# Patient Record
Sex: Female | Born: 1985 | Race: Black or African American | Hispanic: No | Marital: Single | Smoking: Current every day smoker
Health system: Southern US, Community
[De-identification: ages and names within clinical notes are randomized; demographics above are authoritative.]

## PROBLEM LIST (undated history)

## (undated) DIAGNOSIS — Z789 Other specified health status: Secondary | ICD-10-CM

## (undated) DIAGNOSIS — Z720 Tobacco use: Secondary | ICD-10-CM

## (undated) DIAGNOSIS — F1090 Alcohol use, unspecified, uncomplicated: Secondary | ICD-10-CM

## (undated) HISTORY — PX: HERNIA REPAIR: SHX51

---

## 2004-09-17 ENCOUNTER — Emergency Department: Payer: Self-pay | Admitting: General Practice

## 2009-02-08 ENCOUNTER — Ambulatory Visit: Payer: Self-pay | Admitting: Emergency Medicine

## 2009-04-26 ENCOUNTER — Emergency Department: Payer: Self-pay | Admitting: Unknown Physician Specialty

## 2010-03-10 ENCOUNTER — Emergency Department: Payer: Self-pay | Admitting: Emergency Medicine

## 2011-03-10 ENCOUNTER — Emergency Department: Payer: Self-pay | Admitting: Emergency Medicine

## 2011-04-15 ENCOUNTER — Emergency Department: Payer: Self-pay | Admitting: Internal Medicine

## 2011-04-18 ENCOUNTER — Encounter: Payer: Self-pay | Admitting: Emergency Medicine

## 2011-05-16 ENCOUNTER — Encounter: Payer: Self-pay | Admitting: Emergency Medicine

## 2012-06-16 ENCOUNTER — Emergency Department: Payer: Self-pay | Admitting: Emergency Medicine

## 2012-06-16 LAB — URINALYSIS, COMPLETE
Bilirubin,UR: NEGATIVE
Ketone: NEGATIVE
Nitrite: POSITIVE
RBC,UR: 8 /HPF (ref 0–5)
Specific Gravity: 1.021 (ref 1.003–1.030)

## 2012-06-16 LAB — COMPREHENSIVE METABOLIC PANEL
Albumin: 3.7 g/dL (ref 3.4–5.0)
BUN: 8 mg/dL (ref 7–18)
Calcium, Total: 8.7 mg/dL (ref 8.5–10.1)
Potassium: 3.9 mmol/L (ref 3.5–5.1)
SGPT (ALT): 21 U/L (ref 12–78)
Sodium: 139 mmol/L (ref 136–145)

## 2012-06-16 LAB — CBC
HCT: 39.6 % (ref 35.0–47.0)
MCH: 31 pg (ref 26.0–34.0)
MCHC: 33.5 g/dL (ref 32.0–36.0)
RBC: 4.29 10*6/uL (ref 3.80–5.20)
RDW: 13.5 % (ref 11.5–14.5)

## 2012-06-16 LAB — PREGNANCY, URINE: Pregnancy Test, Urine: NEGATIVE m[IU]/mL

## 2012-06-23 ENCOUNTER — Emergency Department: Payer: Self-pay | Admitting: Emergency Medicine

## 2012-06-23 LAB — COMPREHENSIVE METABOLIC PANEL
Alkaline Phosphatase: 70 U/L (ref 50–136)
Anion Gap: 5 — ABNORMAL LOW (ref 7–16)
BUN: 6 mg/dL — ABNORMAL LOW (ref 7–18)
Calcium, Total: 9.4 mg/dL (ref 8.5–10.1)
Creatinine: 0.86 mg/dL (ref 0.60–1.30)
EGFR (African American): >60
EGFR (Non-African Amer.): >60
Glucose: 86 mg/dL (ref 65–99)
Potassium: 3.8 mmol/L (ref 3.5–5.1)
SGPT (ALT): 21 U/L (ref 12–78)
Sodium: 138 mmol/L (ref 136–145)

## 2012-06-23 LAB — URINALYSIS, COMPLETE
Bilirubin,UR: NEGATIVE
Blood: NEGATIVE
Glucose,UR: NEGATIVE mg/dL (ref 0–75)
Ketone: NEGATIVE
Ph: 6 (ref 4.5–8.0)
Protein: NEGATIVE
RBC,UR: 2 /HPF (ref 0–5)
Squamous Epithelial: 9

## 2012-06-23 LAB — CBC WITH DIFFERENTIAL/PLATELET
Eosinophil #: 0 10*3/uL (ref 0.0–0.7)
Eosinophil %: 0.3 %
HGB: 13.8 g/dL (ref 12.0–16.0)
Lymphocyte %: 15.7 %
MCV: 92 fL (ref 80–100)
Monocyte #: 0.5 x10 3/mm (ref 0.2–0.9)
Monocyte %: 5.4 %
Neutrophil #: 7.9 10*3/uL — ABNORMAL HIGH (ref 1.4–6.5)
Platelet: 295 10*3/uL (ref 150–440)
RBC: 4.41 10*6/uL (ref 3.80–5.20)
RDW: 14 % (ref 11.5–14.5)
WBC: 10.1 10*3/uL (ref 3.6–11.0)

## 2012-06-23 LAB — PREGNANCY, URINE: Pregnancy Test, Urine: NEGATIVE m[IU]/mL

## 2012-10-24 ENCOUNTER — Emergency Department: Payer: Self-pay | Admitting: Emergency Medicine

## 2012-11-07 ENCOUNTER — Emergency Department: Payer: Self-pay | Admitting: Internal Medicine

## 2012-11-07 LAB — URINALYSIS, COMPLETE
Bilirubin,UR: NEGATIVE
Blood: NEGATIVE
Ph: 8 (ref 4.5–8.0)
Protein: 100
Specific Gravity: 1.019 (ref 1.003–1.030)
WBC UR: 4 /HPF (ref 0–5)

## 2012-11-07 LAB — COMPREHENSIVE METABOLIC PANEL
Albumin: 4.4 g/dL (ref 3.4–5.0)
Anion Gap: 7 (ref 7–16)
Bilirubin,Total: 0.5 mg/dL (ref 0.2–1.0)
Calcium, Total: 9.3 mg/dL (ref 8.5–10.1)
Chloride: 102 mmol/L (ref 98–107)
Co2: 25 mmol/L (ref 21–32)
Creatinine: 0.63 mg/dL (ref 0.60–1.30)
EGFR (African American): >60
EGFR (Non-African Amer.): >60
Glucose: 109 mg/dL — ABNORMAL HIGH (ref 65–99)
Potassium: 3.7 mmol/L (ref 3.5–5.1)
SGOT(AST): 14 U/L — ABNORMAL LOW (ref 15–37)
SGPT (ALT): 23 U/L (ref 12–78)
Sodium: 134 mmol/L — ABNORMAL LOW (ref 136–145)

## 2012-11-07 LAB — CBC
HCT: 41.9 % (ref 35.0–47.0)
MCV: 91 fL (ref 80–100)
RBC: 4.61 10*6/uL (ref 3.80–5.20)
RDW: 13.7 % (ref 11.5–14.5)
WBC: 10.8 10*3/uL (ref 3.6–11.0)

## 2012-11-07 LAB — LIPASE, BLOOD: Lipase: 79 U/L (ref 73–393)

## 2013-09-24 ENCOUNTER — Emergency Department: Payer: Self-pay | Admitting: Emergency Medicine

## 2013-09-24 LAB — CBC WITH DIFFERENTIAL/PLATELET
Basophil #: 0 10*3/uL (ref 0.0–0.1)
Basophil %: 0.6 %
EOS ABS: 0 10*3/uL (ref 0.0–0.7)
Eosinophil %: 0.3 %
HCT: 37.9 % (ref 35.0–47.0)
HGB: 12.7 g/dL (ref 12.0–16.0)
Lymphocyte #: 2.6 10*3/uL (ref 1.0–3.6)
Lymphocyte %: 29.5 %
MCH: 31.4 pg (ref 26.0–34.0)
MCHC: 33.6 g/dL (ref 32.0–36.0)
MCV: 94 fL (ref 80–100)
MONO ABS: 0.8 x10 3/mm (ref 0.2–0.9)
Monocyte %: 9.4 %
Neutrophil #: 5.2 10*3/uL (ref 1.4–6.5)
Neutrophil %: 60.2 %
Platelet: 237 10*3/uL (ref 150–440)
RBC: 4.05 10*6/uL (ref 3.80–5.20)
RDW: 13.6 % (ref 11.5–14.5)
WBC: 8.7 10*3/uL (ref 3.6–11.0)

## 2013-09-24 LAB — COMPREHENSIVE METABOLIC PANEL
Albumin: 3.5 g/dL (ref 3.4–5.0)
Alkaline Phosphatase: 68 U/L
Anion Gap: 3 — ABNORMAL LOW (ref 7–16)
BUN: 8 mg/dL (ref 7–18)
Bilirubin,Total: 0.2 mg/dL (ref 0.2–1.0)
CHLORIDE: 106 mmol/L (ref 98–107)
CO2: 28 mmol/L (ref 21–32)
Calcium, Total: 9 mg/dL (ref 8.5–10.1)
Creatinine: 0.83 mg/dL (ref 0.60–1.30)
EGFR (African American): >60
Glucose: 88 mg/dL (ref 65–99)
Osmolality: 272 (ref 275–301)
Potassium: 4.1 mmol/L (ref 3.5–5.1)
SGOT(AST): 19 U/L (ref 15–37)
SGPT (ALT): 17 U/L (ref 12–78)
Sodium: 137 mmol/L (ref 136–145)
Total Protein: 7.7 g/dL (ref 6.4–8.2)

## 2013-09-24 LAB — URINALYSIS, COMPLETE
BLOOD: NEGATIVE
Bacteria: NONE SEEN
Bilirubin,UR: NEGATIVE
Glucose,UR: NEGATIVE mg/dL (ref 0–75)
KETONE: NEGATIVE
NITRITE: NEGATIVE
PH: 6 (ref 4.5–8.0)
PROTEIN: NEGATIVE
SPECIFIC GRAVITY: 1.021 (ref 1.003–1.030)
WBC UR: 7 /HPF (ref 0–5)

## 2013-09-24 LAB — PREGNANCY, URINE: PREGNANCY TEST, URINE: NEGATIVE m[IU]/mL

## 2013-11-17 ENCOUNTER — Emergency Department: Payer: Self-pay | Admitting: Emergency Medicine

## 2013-11-17 LAB — COMPREHENSIVE METABOLIC PANEL
ALBUMIN: 4.2 g/dL (ref 3.4–5.0)
ALK PHOS: 83 U/L
Anion Gap: 7 (ref 7–16)
BUN: 5 mg/dL — ABNORMAL LOW (ref 7–18)
Bilirubin,Total: 0.6 mg/dL (ref 0.2–1.0)
CALCIUM: 9.5 mg/dL (ref 8.5–10.1)
CHLORIDE: 99 mmol/L (ref 98–107)
Co2: 27 mmol/L (ref 21–32)
Creatinine: 0.57 mg/dL — ABNORMAL LOW (ref 0.60–1.30)
EGFR (African American): >60
EGFR (Non-African Amer.): >60
Glucose: 117 mg/dL — ABNORMAL HIGH (ref 65–99)
Osmolality: 265 (ref 275–301)
Potassium: 3.9 mmol/L (ref 3.5–5.1)
SGOT(AST): 16 U/L (ref 15–37)
SGPT (ALT): 18 U/L (ref 12–78)
Sodium: 133 mmol/L — ABNORMAL LOW (ref 136–145)
Total Protein: 8.8 g/dL — ABNORMAL HIGH (ref 6.4–8.2)

## 2013-11-17 LAB — URINALYSIS, COMPLETE
BILIRUBIN, UR: NEGATIVE
Blood: NEGATIVE
GLUCOSE, UR: NEGATIVE mg/dL (ref 0–75)
NITRITE: NEGATIVE
Ph: 8 (ref 4.5–8.0)
Protein: 30
Specific Gravity: 1.016 (ref 1.003–1.030)
Squamous Epithelial: 7
WBC UR: 5 /HPF (ref 0–5)

## 2013-11-17 LAB — CBC
HCT: 41.6 % (ref 35.0–47.0)
HGB: 14.1 g/dL (ref 12.0–16.0)
MCH: 31.6 pg (ref 26.0–34.0)
MCHC: 33.8 g/dL (ref 32.0–36.0)
MCV: 93 fL (ref 80–100)
Platelet: 256 10*3/uL (ref 150–440)
RBC: 4.46 10*6/uL (ref 3.80–5.20)
RDW: 13.1 % (ref 11.5–14.5)
WBC: 10.2 10*3/uL (ref 3.6–11.0)

## 2013-11-17 LAB — LIPASE, BLOOD: Lipase: 80 U/L (ref 73–393)

## 2013-11-19 LAB — URINE CULTURE

## 2015-03-31 ENCOUNTER — Encounter: Payer: Self-pay | Admitting: Emergency Medicine

## 2015-03-31 ENCOUNTER — Emergency Department
Admission: EM | Admit: 2015-03-31 | Discharge: 2015-03-31 | Disposition: A | Discharge status: Home / Self Care | Payer: Medicaid Other | Attending: Emergency Medicine | Admitting: Emergency Medicine

## 2015-03-31 DIAGNOSIS — K029 Dental caries, unspecified: Secondary | ICD-10-CM | POA: Insufficient documentation

## 2015-03-31 DIAGNOSIS — Z720 Tobacco use: Secondary | ICD-10-CM | POA: Diagnosis not present

## 2015-03-31 DIAGNOSIS — K0381 Cracked tooth: Secondary | ICD-10-CM | POA: Diagnosis not present

## 2015-03-31 DIAGNOSIS — Z880 Allergy status to penicillin: Secondary | ICD-10-CM | POA: Insufficient documentation

## 2015-03-31 DIAGNOSIS — K0889 Other specified disorders of teeth and supporting structures: Secondary | ICD-10-CM | POA: Diagnosis present

## 2015-03-31 MED ORDER — TRAMADOL HCL 50 MG PO TABS
50.0000 mg | ORAL_TABLET | Freq: Once | ORAL | Status: AC
  Administered 2015-03-31: 50 mg via ORAL
  Filled 2015-03-31: qty 1

## 2015-03-31 MED ORDER — NABUMETONE 750 MG PO TABS: 750.0000 mg | ORAL_TABLET | Freq: Two times a day (BID) | ORAL | Status: DC

## 2015-03-31 MED ORDER — IPRATROPIUM-ALBUTEROL 0.5-2.5 (3) MG/3ML IN SOLN
RESPIRATORY_TRACT | Status: AC
  Filled 2015-03-31: qty 3

## 2015-03-31 MED ORDER — CLINDAMYCIN HCL 300 MG PO CAPS: 300.0000 mg | ORAL_CAPSULE | Freq: Four times a day (QID) | ORAL | Status: DC

## 2015-03-31 MED ORDER — LIDOCAINE-EPINEPHRINE 2 %-1:100000 IJ SOLN
1.7000 mL | Freq: Once | INTRAMUSCULAR | Status: AC
  Administered 2015-03-31: 1.7 mL
  Filled 2015-03-31: qty 1.7

## 2015-03-31 NOTE — ED Notes (Signed)
Pt report for past 2 weeks with pain in tooth. Pt reports swelling in gums and right ear pain. Pt reports temp of 99 orally

## 2015-03-31 NOTE — Discharge Instructions (Signed)
Dental Caries Dental caries is tooth decay. This decay can cause a hole in teeth (cavity) that can get bigger and deeper over time. HOME CARE  Brush and floss your teeth. Do this at least two times a day.  Use a fluoride toothpaste.  Use a mouth rinse if told by your dentist or doctor.  Eat less sugary and starchy foods. Drink less sugary drinks.  Avoid snacking often on sugary and starchy foods. Avoid sipping often on sugary drinks.  Keep regular checkups and cleanings with your dentist.  Use fluoride supplements if told by your dentist or doctor.  Allow fluoride to be applied to teeth if told by your dentist or doctor.   This information is not intended to replace advice given to you by your health care provider. Make sure you discuss any questions you have with your health care provider.   Document Released: 03/10/2008 Document Revised: 06/22/2014 Document Reviewed: 06/03/2012 Elsevier Interactive Patient Education 2016 Elsevier Inc.  Dental Pain Dental pain may be caused by many things, including:  Tooth decay (cavities or caries). Cavities expose the nerve of your tooth to air and hot or cold temperatures. This can cause pain or discomfort.  Abscess or infection. A dental abscess is a collection of infected pus from a bacterial infection in the inner part of the tooth (pulp). It usually occurs at the end of the tooth's root.  Injury.  An unknown reason (idiopathic). Your pain may be mild or severe. It may only occur when:  You are chewing.  You are exposed to hot or cold temperature.  You are eating or drinking sugary foods or beverages, such as soda or candy. Your pain may also be constant. HOME CARE INSTRUCTIONS Watch your dental pain for any changes. The following actions may help to lessen any discomfort that you are feeling:  Take medicines only as directed by your dentist.  If you were prescribed an antibiotic medicine, finish all of it even if you start to  feel better.  Keep all follow-up visits as directed by your dentist. This is important.  Do not apply heat to the outside of your face.  Rinse your mouth or gargle with salt water if directed by your dentist. This helps with pain and swelling.  You can make salt water by adding  tsp of salt to 1 cup of warm water.  Apply ice to the painful area of your face:  Put ice in a plastic bag.  Place a towel between your skin and the bag.  Leave the ice on for 20 minutes, 2-3 times per day.  Avoid foods or drinks that cause you pain, such as:  Very hot or very cold foods or drinks.  Sweet or sugary foods or drinks. SEEK MEDICAL CARE IF:  Your pain is not controlled with medicines.  Your symptoms are worse.  You have new symptoms. SEEK IMMEDIATE MEDICAL CARE IF:  You are unable to open your mouth.  You are having trouble breathing or swallowing.  You have a fever.  Your face, neck, or jaw is swollen.   This information is not intended to replace advice given to you by your health care provider. Make sure you discuss any questions you have with your health care provider.   Document Released: 06/01/2005 Document Revised: 10/16/2014 Document Reviewed: 05/28/2014 Elsevier Interactive Patient Education 2016 [employer]   Take the prescription meds as directed.  Follow-up with one of the dental providers on the list below:  OPTIONS  FOR DENTAL FOLLOW UP CARE  [place] [facility] - Local Safety Net Dental Clinics [email or web address]   [facility] [phone])  [facility] [phone])  [place] [phone])  [facility] [phone])  [facility] [phone]) This clinic caters to the indigent population and is on a lottery system. Location: [employer] [facility], [employer], [address], [facility]  Hours: Wednesdays from 6pm - 9pm, patients seen by a lottery system. For dates, call or go to [email or web address] Services: Cleanings, fillings and simple extractions. Payment Options: DENTAL WORK IS FREE OF CHARGE. Bring proof of income or support. Best way to get seen: Arrive at 5:15 pm - this is a lottery, NOT first come/first serve, so arriving earlier will not increase your chances of being seen.     [facility] [phone] Select option 1 for emergencies   Location: [facility], [place], [address] [place] [facility] Hours: No walk-ins accepted - call the day before to schedule an appointment. Check in times are 9:30 am and 1:30 pm. Services: Simple extractions, temporary fillings, pulpectomy/pulp debridement, uncomplicated abscess drainage. Payment Options: PAYMENT IS DUE AT THE TIME OF SERVICE.  Fee is usually $100-200, additional surgical procedures (e.g. abscess drainage) may be extra. Cash, checks, Visa/MasterCard accepted.  Can file Medicaid if patient is covered for dental - patient should call case worker to check. No discount for [facility] patients. Best way to get seen: MUST call the day before and get onto the schedule. Can usually be seen the next 1-2 days. No walk-ins accepted.     [facility] [phone]   Location: [facility], [address] [place] [facility] Hours: M, W, Th, F 8am or 1:30pm, Tues 9a or 1:30 - first come/first served. Services: Simple extractions, temporary fillings, uncomplicated abscess drainage.  You do not need to be an [facility] resident. Payment Options: PAYMENT IS DUE AT THE TIME OF SERVICE. Dental insurance, otherwise sliding scale - bring proof of income or support. Depending on income and treatment needed, cost is usually $50-200. Best way to get seen: Arrive early as it is first come/first served.      [facility] [phone]   Location: [address] Clinic Hours: Mon-Thu 8a-5p Services: Most basic dental services including extractions and fillings. Payment Options: PAYMENT IS DUE AT THE TIME OF SERVICE. Sliding scale, up to 50% off - bring proof if income or support. Medicaid with dental option accepted. Best way to get seen: Call to schedule an appointment, can usually be seen within 2 weeks OR they will try to see walk-ins - show up at 8a or 2p (you may have to wait).     [facility] [phone] [place] RESIDENTS ONLY   Location: [facility], [address] [place], [place] [address] Clinic Hours: By appointment only. Monday - Thursday 8am-5pm, Friday 8am-12pm Services: Cleanings, fillings, extractions. Payment Options: PAYMENT IS DUE AT THE TIME OF SERVICE. Cash, Visa or MasterCard. Sliding scale - $30 minimum per service. Best way to get seen: Come in to office, complete packet and make an appointment - need proof of income or support monies for each household member and proof of [facility] residence. Usually takes about a month to get in.     [facility] [phone]   Location: [address],  [place] [facility] Hours: Walk-in Urgent Care Dental Services are offered Monday-Friday mornings only. The numbers of emergencies accepted daily is limited to the number of providers available. Maximum 15 - Mondays, Wednesdays & Thursdays Maximum 10 - Tuesdays & Fridays Services: You do not need to be a [facility] resident to be seen for a dental emergency. Emergencies are defined as pain, swelling, abnormal bleeding, or dental trauma. Walkins will receive x-rays if needed. NOTE: Dental cleaning is not an emergency. Payment Options: PAYMENT IS DUE AT THE TIME OF SERVICE. Minimum co-pay is $40.00 for uninsured patients. Minimum co-pay is  $3.00 for Medicaid with dental coverage. Dental Insurance is accepted and must be presented at time of visit. Medicare does not cover dental. Forms of payment: Cash, credit card, checks. Best way to get seen: If not previously registered with the clinic, walk-in dental registration begins at 7:15 am and is on a first come/first serve basis. If previously registered with the clinic, call to make an appointment.     [facility] [phone] [place] RESIDENTS ONLY   Location: [address], [place], [place] Clinic Hours: Mon-Thu 10a-2p Services: Extractions only! Payment Options: FREE (donations accepted) - bring proof of income or support Best way to get seen: Call and schedule an appointment OR come at 8am on the 1st Monday of every month (except for holidays) when it is first come/first served.     [facility] [phone]   Location: [address] [place], [place] Clinic Hours: Friday mornings Services, Payment Options, Best way to get seen: Call for info

## 2015-03-31 NOTE — ED Provider Notes (Signed)
[facility] Emergency Department Provider Note ____________________________________________  Time seen: 0715  I have reviewed the triage vital signs and the nursing notes.  HISTORY  Chief Complaint  Dental Pain  HPI Meghan Olson is a [age] y.o. female reports to the ED for evaluation and management of dental pain for the last 3 days. She notes pain to the upper and lower wisdom teeth on the right-hand side, noting the lower tooth is worse. She denies any interim fever, chills, sweats. She admits to a chronic break to the lower molar, butit has been increasingly painful over the last few days. She has not found relief with topical application of Goody's powder to the fractured tooth, and oral doses of Tylenol and Motrin.She rates her pain at a 10/10 in triage.  History reviewed. No pertinent past medical history.  There are no active problems to display for this patient.  Past Surgical History  Procedure Laterality Date  . Hernia repair      Current Outpatient Rx  Name  Route  Sig  Dispense  Refill  . clindamycin (CLEOCIN) 300 MG capsule   Oral   Take 1 capsule (300 mg total) by mouth 4 (four) times daily.   40 capsule   0   . nabumetone (RELAFEN) 750 MG tablet   Oral   Take 1 tablet (750 mg total) by mouth 2 (two) times daily.   30 tablet   0     Allergies Penicillins  No family history on file.  Social History Social History  Substance Use Topics  . Smoking status: Current Every Day Smoker -- 0.50 packs/day    Types: Cigarettes  . Smokeless tobacco: Never Used  . Alcohol Use: Yes     Comment: occas.   Review of Systems  Constitutional: Negative for fever. Eyes: Negative for visual changes. ENT: Negative for sore throat. Dental pain as above Cardiovascular: Negative for chest pain. Respiratory: Negative for shortness of breath. Gastrointestinal: Negative for abdominal pain, vomiting and diarrhea. Genitourinary: Negative for  dysuria. Musculoskeletal: Negative for back pain. Skin: Negative for rash. Neurological: Negative for headaches, focal weakness or numbness. ____________________________________________  PHYSICAL EXAM:  VITAL SIGNS: ED Triage Vitals  Enc Vitals Group     BP 03/31/15 0706 145/100 mmHg     Pulse Rate 03/31/15 0706 73     Resp 03/31/15 0706 16     Temp 03/31/15 0706 98.8 F (37.1 C)     Temp Source 03/31/15 0706 Oral     SpO2 03/31/15 0706 100 %     Weight 03/31/15 0706 170 lb (77.111 kg)     Height 03/31/15 0706 5\' 4"  (1.626 m)     Head Cir --      Peak Flow --      Pain Score 03/31/15 0709 10     Pain Loc --      Pain Edu? --      Excl. in GC? --    Constitutional: Alert and oriented. Well appearing and in no distress. Head: Normocephalic and atraumatic.      Eyes: Conjunctivae are normal. PERRL. Normal extraocular movements      Ears: Canals clear. TMs intact bilaterally.   Nose: No congestion/rhinorrhea.   Mouth/Throat: Mucous membranes are moist. Uvula midline. Tonsils flat. Upper right wisdom tooth intact with buccal cavity noted. Lower right wisdom tooth with posterior fracture due to chronic caries. Some local gum maceration noted due to topical application of Goody's powder.    Neck:  Supple. No thyromegaly. Hematological/Lymphatic/Immunological: No cervical lymphadenopathy. Cardiovascular: Normal rate, regular rhythm.  Respiratory: Normal respiratory effort. No wheezes/rales/rhonchi. Gastrointestinal: Soft and nontender. No distention. Musculoskeletal: Nontender with normal range of motion in all extremities.  Neurologic:  Normal gait without ataxia. Normal speech and language. No gross focal neurologic deficits are appreciated. Skin:  Skin is warm, dry and intact. No rash noted. Psychiatric: Mood and affect are normal. Patient exhibits appropriate insight and judgment. ____________________________________________  PROCEDURES  Ultram 50 mg PO  DENTAL  BLOCK  Performed by: Lissa Hoard Consent: Verbal consent obtained. Required items: devices and special equipment available Time out: Immediately prior to procedure a "time out" was called to verify the correct patient, procedure, equipment, support staff and site/side marked as required.  Indication: pain control Nerve block body site: dental #1 & #32  Preparation: Patient was prepped and draped in the usual sterile fashion. Needle gauge: 27 G Location technique: anatomical landmarks  Local anesthetic: 1% lido w/ epi  Anesthetic total: 1 ml  Outcome: pain improved. Now 2/10 on pain scale Patient tolerance: Patient tolerated the procedure well with no immediate complications. ____________________________________________  INITIAL IMPRESSION / ASSESSMENT AND PLAN / ED COURSE  Acute dental pain secondary to dental caries and chronically fractured tooth. Patient is provided with a prescription for Relafen and clindamycin. She is given a list of dental providers for definitive management. ____________________________________________  FINAL CLINICAL IMPRESSION(S) / ED DIAGNOSES  Final diagnoses:  Pain due to dental caries  Broken or cracked tooth, nontraumatic      Lissa Hoard, PA-C 03/31/15 0810  Loleta Rose, MD 03/31/15 1545

## 2015-03-31 NOTE — ED Notes (Signed)
Explained to patient to complete entire atbx course and not to take any other NSAIDs with Nabumetone

## 2015-12-28 ENCOUNTER — Emergency Department: Payer: Medicaid Other

## 2015-12-28 ENCOUNTER — Emergency Department
Admission: EM | Admit: 2015-12-28 | Discharge: 2015-12-28 | Disposition: A | Discharge status: Home / Self Care | Payer: Medicaid Other | Attending: Emergency Medicine | Admitting: Emergency Medicine

## 2015-12-28 ENCOUNTER — Encounter: Payer: Self-pay | Admitting: Emergency Medicine

## 2015-12-28 DIAGNOSIS — Z79899 Other long term (current) drug therapy: Secondary | ICD-10-CM | POA: Insufficient documentation

## 2015-12-28 DIAGNOSIS — F17210 Nicotine dependence, cigarettes, uncomplicated: Secondary | ICD-10-CM | POA: Diagnosis not present

## 2015-12-28 DIAGNOSIS — Z792 Long term (current) use of antibiotics: Secondary | ICD-10-CM | POA: Diagnosis not present

## 2015-12-28 DIAGNOSIS — M79644 Pain in right finger(s): Secondary | ICD-10-CM | POA: Diagnosis present

## 2015-12-28 DIAGNOSIS — Y999 Unspecified external cause status: Secondary | ICD-10-CM | POA: Diagnosis not present

## 2015-12-28 DIAGNOSIS — S63601A Unspecified sprain of right thumb, initial encounter: Secondary | ICD-10-CM | POA: Diagnosis not present

## 2015-12-28 DIAGNOSIS — Y939 Activity, unspecified: Secondary | ICD-10-CM | POA: Insufficient documentation

## 2015-12-28 DIAGNOSIS — W19XXXA Unspecified fall, initial encounter: Secondary | ICD-10-CM | POA: Diagnosis not present

## 2015-12-28 DIAGNOSIS — Y929 Unspecified place or not applicable: Secondary | ICD-10-CM | POA: Diagnosis not present

## 2015-12-28 MED ORDER — TRAMADOL HCL 50 MG PO TABS: 50.0000 mg | ORAL_TABLET | Freq: Four times a day (QID) | ORAL | Status: AC | PRN

## 2015-12-28 MED ORDER — TRAMADOL HCL 50 MG PO TABS
50.0000 mg | ORAL_TABLET | Freq: Once | ORAL | Status: AC
  Administered 2015-12-28: 50 mg via ORAL
  Filled 2015-12-28: qty 1

## 2015-12-28 MED ORDER — IBUPROFEN 600 MG PO TABS
600.0000 mg | ORAL_TABLET | Freq: Once | ORAL | Status: AC
  Administered 2015-12-28: 600 mg via ORAL
  Filled 2015-12-28: qty 1

## 2015-12-28 MED ORDER — IBUPROFEN 600 MG PO TABS: 600.0000 mg | ORAL_TABLET | Freq: Three times a day (TID) | ORAL | Status: DC | PRN

## 2015-12-28 NOTE — ED Notes (Signed)
R thumb pain post fall 2 days ago.

## 2015-12-28 NOTE — Discharge Instructions (Signed)
Finger Sprain A finger sprain happens when the bands of tissue that hold the finger bones together (ligaments) stretch too much and tear. HOME CARE: Wear splint for 3 days.  Keep your injured finger raised (elevated) when possible.  Put ice on the injured area, twice a day, for 2 to 3 days.  Put ice in a plastic bag.  Place a towel between your skin and the bag.  Leave the ice on for 15 minutes.  Only take medicine as told by your doctor.  Do not wear rings on the injured finger.  Protect your finger until pain and stiffness go away (usually 3 to 4 weeks).  Do not get your cast or splint to get wet. Cover your cast or splint with a plastic bag when you shower or bathe. Do not swim.  Your doctor may suggest special exercises for you to do. These exercises will help keep or stop stiffness from happening. GET HELP RIGHT AWAY IF:  Your cast or splint gets damaged.  Your pain gets worse, not better. MAKE SURE YOU:  Understand these instructions.  Will watch your condition.  Will get help right away if you are not doing well or get worse.   This information is not intended to replace advice given to you by your health care provider. Make sure you discuss any questions you have with your health care provider.   Document Released: 07/04/2010 Document Revised: 08/24/2011 Document Reviewed: 02/02/2011 Elsevier Interactive Patient Education [employer]2016 Elsevier Inc.

## 2015-12-28 NOTE — ED Provider Notes (Signed)
[facility] Emergency Department Provider Note   ____________________________________________  Time seen: Approximately 4:11 PM  I have reviewed the triage vital signs and the nursing notes.   HISTORY  Chief Complaint Hand Pain    HPI Emmaclaire Switala Dilling is a 30 y.o. female patient complaining of right thumb pain status post fall 2 days ago. Patient stated pain increases with flexion and extension of the right thumb. No palliative measures taken for this complaint. Patient rates her pain discomfort as 8/10. Patient describes the pain as "sharp". Patient is right-hand dominant.   History reviewed. No pertinent past medical history.  There are no active problems to display for this patient.   Past Surgical History  Procedure Laterality Date  . Hernia repair      Current Outpatient Rx  Name  Route  Sig  Dispense  Refill  . clindamycin (CLEOCIN) 300 MG capsule   Oral   Take 1 capsule (300 mg total) by mouth 4 (four) times daily.   40 capsule   0   . ibuprofen (ADVIL,MOTRIN) 600 MG tablet   Oral   Take 1 tablet (600 mg total) by mouth every 8 (eight) hours as needed.   15 tablet   0   . nabumetone (RELAFEN) 750 MG tablet   Oral   Take 1 tablet (750 mg total) by mouth 2 (two) times daily.   30 tablet   0   . traMADol (ULTRAM) 50 MG tablet   Oral   Take 1 tablet (50 mg total) by mouth every 6 (six) hours as needed.   20 tablet   0     Allergies Penicillins  No family history on file.  Social History Social History  Substance Use Topics  . Smoking status: Current Every Day Smoker -- 0.50 packs/day    Types: Cigarettes  . Smokeless tobacco: Never Used  . Alcohol Use: Yes     Comment: occas.    Review of Systems Constitutional: No fever/chills Eyes: No visual changes. ENT: No sore throat. Cardiovascular: Denies chest pain. Respiratory: Denies shortness of breath. Gastrointestinal: No abdominal pain.  No nausea, no  vomiting.  No diarrhea.  No constipation. Genitourinary: Negative for dysuria. Musculoskeletal: Negative for back pain. Skin: Negative for rash. Neurological: Negative for headaches, focal weakness or numbness. Allergic/Immunilogical. Penicillin  ____________________________________________   PHYSICAL EXAM:  VITAL SIGNS: ED Triage Vitals  Enc Vitals Group     BP 12/28/15 1543 130/80 mmHg     Pulse Rate 12/28/15 1543 109     Resp 12/28/15 1543 20     Temp 12/28/15 1543 100.1 F (37.8 C)     Temp Source 12/28/15 1543 Oral     SpO2 12/28/15 1543 97 %     Weight 12/28/15 1543 186 lb (84.369 kg)     Height 12/28/15 1543  (1.626 m)     Head Cir --      Peak Flow --      Pain Score 12/28/15 1545 8     Pain Loc --      Pain Edu? --      Excl. in GC? --     Constitutional: Alert and oriented. Well appearing and in no acute distress. Eyes: Conjunctivae are normal. PERRL. EOMI. Head: Atraumatic. Nose: No congestion/rhinnorhea. Mouth/Throat: Mucous membranes are moist.  Oropharynx non-erythematous. Neck: No stridor.  No cervical spine tenderness to palpation. Hematological/Lymphatic/Immunilogical: No cervical lymphadenopathy. Cardiovascular: Normal rate, regular rhythm. Grossly normal heart sounds.  Good  peripheral circulation. Respiratory: Normal respiratory effort.  No retractions. Lungs CTAB. Gastrointestinal: Soft and nontender. No distention. No abdominal bruits. No CVA tenderness. Musculoskeletal: No obvious deformity of the right thumb. There is some mild edema. Patient neurovascularly intact. Decreased range of motion limited by complaining of pain with flexion and extension. Neurologic:  Normal speech and language. No gross focal neurologic deficits are appreciated. No gait instability. Skin:  Skin is warm, dry and intact. No rash noted. Psychiatric: Mood and affect are normal. Speech and behavior are normal.  ____________________________________________    LABS (all labs ordered are listed, but only abnormal results are displayed)  Labs Reviewed - No data to display ____________________________________________  EKG ____________________________________________  RADIOLOGY  No acute final x-ray of the right thumb. ____________________________________________   PROCEDURES  Procedure(s) performed: None  Procedures  Critical Care performed: No  ____________________________________________   INITIAL IMPRESSION / ASSESSMENT AND PLAN / ED COURSE  Pertinent labs & imaging results that were available during my care of the patient were reviewed by me and considered in my medical decision making (see chart for details).  Sprain right thumb. Discussed x-ray finding with patient. Patient placed in a thumb spica splint. Patient given prescription for tramadol and ibuprofen. Patient given work limitations. Patient [facility]. ____________________________________________   FINAL CLINICAL IMPRESSION(S) / ED DIAGNOSES  Final diagnoses:  Sprain of right thumb, initial encounter      NEW MEDICATIONS STARTED DURING THIS VISIT:  New Prescriptions   IBUPROFEN (ADVIL,MOTRIN) 600 MG TABLET    Take 1 tablet (600 mg total) by mouth every 8 (eight) hours as needed.   TRAMADOL (ULTRAM) 50 MG TABLET    Take 1 tablet (50 mg total) by mouth every 6 (six) hours as needed.     Note:  This document was prepared using Dragon voice recognition software and may include unintentional dictation errors.    Joni ReiningRonald K Temiloluwa Recchia, PA-C 12/28/15 [id number]  Sharyn CreamerMark Quale, MD 12/28/15 2115

## 2016-06-06 ENCOUNTER — Emergency Department: Payer: No Typology Code available for payment source

## 2016-06-06 ENCOUNTER — Encounter: Payer: Self-pay | Admitting: Emergency Medicine

## 2016-06-06 ENCOUNTER — Emergency Department
Admission: EM | Admit: 2016-06-06 | Discharge: 2016-06-06 | Disposition: A | Discharge status: Home / Self Care | Payer: No Typology Code available for payment source | Attending: Emergency Medicine | Admitting: Emergency Medicine

## 2016-06-06 DIAGNOSIS — S60221A Contusion of right hand, initial encounter: Secondary | ICD-10-CM | POA: Insufficient documentation

## 2016-06-06 DIAGNOSIS — S0990XA Unspecified injury of head, initial encounter: Secondary | ICD-10-CM | POA: Diagnosis present

## 2016-06-06 DIAGNOSIS — S8001XA Contusion of right knee, initial encounter: Secondary | ICD-10-CM | POA: Diagnosis not present

## 2016-06-06 DIAGNOSIS — F17210 Nicotine dependence, cigarettes, uncomplicated: Secondary | ICD-10-CM | POA: Diagnosis not present

## 2016-06-06 DIAGNOSIS — Y999 Unspecified external cause status: Secondary | ICD-10-CM | POA: Diagnosis not present

## 2016-06-06 DIAGNOSIS — Y939 Activity, unspecified: Secondary | ICD-10-CM | POA: Insufficient documentation

## 2016-06-06 DIAGNOSIS — Z79899 Other long term (current) drug therapy: Secondary | ICD-10-CM | POA: Insufficient documentation

## 2016-06-06 DIAGNOSIS — S060X0A Concussion without loss of consciousness, initial encounter: Secondary | ICD-10-CM | POA: Diagnosis not present

## 2016-06-06 DIAGNOSIS — Y92410 Unspecified street and highway as the place of occurrence of the external cause: Secondary | ICD-10-CM | POA: Diagnosis not present

## 2016-06-06 DIAGNOSIS — S0083XA Contusion of other part of head, initial encounter: Secondary | ICD-10-CM | POA: Insufficient documentation

## 2016-06-06 MED ORDER — MELOXICAM 15 MG PO TABS: 15.0000 mg | ORAL_TABLET | Freq: Every day | ORAL | 0 refills | Status: DC

## 2016-06-06 MED ORDER — METHOCARBAMOL 500 MG PO TABS: 500.0000 mg | ORAL_TABLET | Freq: Four times a day (QID) | ORAL | 0 refills | Status: DC

## 2016-06-06 MED ORDER — OXYCODONE-ACETAMINOPHEN 5-325 MG PO TABS
ORAL_TABLET | ORAL | Status: AC
  Filled 2016-06-06: qty 1

## 2016-06-06 MED ORDER — OXYCODONE-ACETAMINOPHEN 5-325 MG PO TABS
1.0000 | ORAL_TABLET | ORAL | Status: DC | PRN
  Administered 2016-06-06: 1 via ORAL
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
## 2018-03-27 ENCOUNTER — Emergency Department
Admission: EM | Admit: 2018-03-27 | Discharge: 2018-03-27 | Disposition: A | Discharge status: Home / Self Care | Payer: Medicaid Other | Attending: Emergency Medicine | Admitting: Emergency Medicine

## 2018-03-27 ENCOUNTER — Other Ambulatory Visit: Payer: Self-pay

## 2018-03-27 ENCOUNTER — Emergency Department: Payer: Medicaid Other

## 2018-03-27 DIAGNOSIS — F17210 Nicotine dependence, cigarettes, uncomplicated: Secondary | ICD-10-CM | POA: Insufficient documentation

## 2018-03-27 DIAGNOSIS — M545 Low back pain: Secondary | ICD-10-CM | POA: Diagnosis not present

## 2018-03-27 DIAGNOSIS — M25511 Pain in right shoulder: Secondary | ICD-10-CM | POA: Insufficient documentation

## 2018-03-27 DIAGNOSIS — Y998 Other external cause status: Secondary | ICD-10-CM | POA: Diagnosis not present

## 2018-03-27 DIAGNOSIS — Z79899 Other long term (current) drug therapy: Secondary | ICD-10-CM | POA: Insufficient documentation

## 2018-03-27 DIAGNOSIS — M25551 Pain in right hip: Secondary | ICD-10-CM | POA: Diagnosis not present

## 2018-03-27 DIAGNOSIS — Y939 Activity, unspecified: Secondary | ICD-10-CM | POA: Insufficient documentation

## 2018-03-27 DIAGNOSIS — Y92410 Unspecified street and highway as the place of occurrence of the external cause: Secondary | ICD-10-CM | POA: Insufficient documentation

## 2018-03-27 LAB — POCT PREGNANCY, URINE: PREG TEST UR: NEGATIVE

## 2018-03-27 MED ORDER — CYCLOBENZAPRINE HCL 5 MG PO TABS: 5.0000 mg | ORAL_TABLET | Freq: Three times a day (TID) | ORAL | 0 refills | Status: AC | PRN

## 2018-03-27 MED ORDER — MELOXICAM 15 MG PO TABS: 15.0000 mg | ORAL_TABLET | Freq: Every day | ORAL | 0 refills | Status: DC
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
## 2018-07-12 ENCOUNTER — Emergency Department
Admission: EM | Admit: 2018-07-12 | Discharge: 2018-07-12 | Disposition: A | Discharge status: Home / Self Care | Payer: Medicaid Other | Attending: Emergency Medicine | Admitting: Emergency Medicine

## 2018-07-12 ENCOUNTER — Encounter: Payer: Self-pay | Admitting: Emergency Medicine

## 2018-07-12 ENCOUNTER — Other Ambulatory Visit: Payer: Self-pay

## 2018-07-12 DIAGNOSIS — F17210 Nicotine dependence, cigarettes, uncomplicated: Secondary | ICD-10-CM | POA: Insufficient documentation

## 2018-07-12 DIAGNOSIS — K029 Dental caries, unspecified: Secondary | ICD-10-CM | POA: Diagnosis not present

## 2018-07-12 DIAGNOSIS — K047 Periapical abscess without sinus: Secondary | ICD-10-CM | POA: Diagnosis not present

## 2018-07-12 DIAGNOSIS — Z79899 Other long term (current) drug therapy: Secondary | ICD-10-CM | POA: Diagnosis not present

## 2018-07-12 DIAGNOSIS — K0889 Other specified disorders of teeth and supporting structures: Secondary | ICD-10-CM | POA: Diagnosis present

## 2018-07-12 MED ORDER — LIDOCAINE VISCOUS HCL 2 % MT SOLN
15.0000 mL | Freq: Once | OROMUCOSAL | Status: AC
  Administered 2018-07-12: 15 mL via OROMUCOSAL
  Filled 2018-07-12: qty 15

## 2018-07-12 MED ORDER — OXYCODONE-ACETAMINOPHEN 5-325 MG PO TABS
1.0000 | ORAL_TABLET | Freq: Once | ORAL | Status: AC
  Administered 2018-07-12: 1 via ORAL
  Filled 2018-07-12: qty 1

## 2018-07-12 MED ORDER — CLINDAMYCIN HCL 300 MG PO CAPS: 300.0000 mg | ORAL_CAPSULE | Freq: Three times a day (TID) | ORAL | 0 refills | Status: AC

## 2018-07-12 MED ORDER — LIDOCAINE VISCOUS HCL 2 % MT SOLN: 10.0000 mL | OROMUCOSAL | 0 refills | Status: DC | PRN

## 2018-07-12 MED ORDER — IBUPROFEN 600 MG PO TABS: 600.0000 mg | ORAL_TABLET | Freq: Four times a day (QID) | ORAL | 0 refills | Status: DC | PRN

## 2018-07-12 NOTE — ED Triage Notes (Signed)
Right side dental pain and swelling since Friday.

## 2018-07-12 NOTE — ED Notes (Signed)
See triage note  Presents with dental pain and possible dental abscess   States she has ha broken tooth to right lower gumline

## 2018-07-12 NOTE — Discharge Instructions (Addendum)
OPTIONS FOR DENTAL FOLLOW UP CARE ° °[place] Department of Health and Human Services - Local Safety Net Dental Clinics °http://www.ncdhhs.gov/dph/oralhealth/services/safetynetclinics.htm °  °[facility] (336-562-3123) ° °[facility] [place] ([phone]) ° °[facility] ([phone] ext [phone]) ° °[facility] ([phone]) ° °[facility] ([phone]) °This clinic caters to the indigent population and is on a lottery system. °Location: °[facility], [address], [place] °Clinic Hours: °Wednesdays from 6pm - 9pm, patients seen by a lottery system. °For dates, call or go to [email or web address].[facility].edu/[facility]/patients/Dental-[facility] °Services: °Cleanings, fillings and simple extractions. °Payment Options: °DENTAL WORK IS FREE OF CHARGE. Bring proof of income or support. °Best way to get seen: °Arrive at 5:15 pm - this is a lottery, NOT first come/first serve, so arriving earlier will not increase your chances of being seen. °  °  °[facility] °[phone] °Select option 1 for emergencies °  °Location: °[facility], [address], [place] °Clinic Hours: °No walk-ins accepted - call the day before to schedule an appointment. °Check in times are 9:30 am and 1:30 pm. °Services: °Simple extractions, temporary fillings, pulpectomy/pulp debridement, uncomplicated abscess drainage. °Payment Options: °PAYMENT IS DUE AT THE TIME OF SERVICE.  Fee is usually $100-200, additional surgical procedures (e.g. abscess drainage) may be extra. °Cash, checks, Visa/MasterCard accepted.  Can file Medicaid if patient is covered for dental - patient should call case worker to check. °No discount for [facility] patients. °Best way to get seen: °MUST call the day before and get onto the schedule. Can usually be seen the next 1-2 days. No walk-ins accepted. °  °  °[facility] °[phone] °   °Location: °[facility], [address], [place] °Clinic Hours: °M, W, Th, F 8am or 1:30pm, Tues 9a or 1:30 - first come/first served. °Services: °Simple extractions, temporary fillings, uncomplicated abscess drainage.  You do not need to be an [place] resident. °Payment Options: °PAYMENT IS DUE AT THE TIME OF SERVICE. °Dental insurance, otherwise sliding scale - bring proof of income or support. °Depending on income and treatment needed, cost is usually $50-200. °Best way to get seen: °Arrive early as it is first come/first served. °  °  °[facility] °[phone] °  °Location: °[address] °Clinic Hours: °Mon-Thu 8a-5p °Services: °Most basic dental services including extractions and fillings. °Payment Options: °PAYMENT IS DUE AT THE TIME OF SERVICE. °Sliding scale, up to 50% off - bring proof if income or support. °Medicaid with dental option accepted. °Best way to get seen: °Call to schedule an appointment, can usually be seen within 2 weeks OR they will try to see walk-ins - show up at 8a or 2p (you may have to wait). °  °  °[facility] °[phone] °[place] RESIDENTS ONLY °  °Location: °[facility], [address], [place], [place] [address] °Clinic Hours: By appointment only. °Monday - Thursday 8am-5pm, Friday 8am-12pm °Services: Cleanings, fillings, extractions. °Payment Options: °PAYMENT IS DUE AT THE TIME OF SERVICE. °Cash, Visa or MasterCard. Sliding scale - $30 minimum per service. °Best way to get seen: °Come in to office, complete packet and make an appointment - need proof of income °or support monies for each household member and proof of [place] residence. °Usually takes about a month to get in. °  °  °[facility] °[phone] °  °Location: °[address]   [place] °Clinic Hours: Walk-in Urgent Care Dental Services are offered Monday-Friday  mornings only. °The numbers of emergencies accepted daily is limited to the number of °providers available. °Maximum 15 - Mondays, Wednesdays & Thursdays °Maximum 10 - Tuesdays & Fridays °Services: °You do not need to be a [place] resident to be seen for a dental emergency. °Emergencies are defined as pain, swelling, abnormal bleeding, or dental trauma. Walkins will receive x-rays if needed. °NOTE: Dental cleaning is not an emergency. °Payment Options: °PAYMENT IS DUE AT THE TIME OF SERVICE. °Minimum co-pay is $40.00 for uninsured patients. °Minimum co-pay is $3.00 for Medicaid with dental coverage. °Dental Insurance is accepted and must be presented at time of visit. °Medicare does not cover dental. °Forms of payment: Cash, credit card, checks. °Best way to get seen: °If not previously registered with the clinic, walk-in dental registration begins at 7:15 am and is on a first come/first serve basis. °If previously registered with the clinic, call to make an appointment. °  °  °[facility] °[phone] °[place] RESIDENTS ONLY °  °Location: °[address], [place], [place] °Clinic Hours: °Mon-Thu 10a-2p °Services: Extractions only! °Payment Options: °FREE (donations accepted) - bring proof of income or support °Best way to get seen: °Call and schedule an appointment OR come at 8am on the 1st Monday of every month (except for holidays) when it is first come/first served. °  °  °[facility] °[phone] °  °Location: °[address], [place] °Clinic Hours: °Friday mornings °Services, Payment Options, Best way to get seen: °Call for info °

## 2018-07-12 NOTE — ED Provider Notes (Signed)
[facility] Emergency Department Provider Note  ____________________________________________  Time seen: Approximately 10:51 AM  I have reviewed the triage vital signs and the nursing notes.   HISTORY  Chief Complaint Dental Pain    HPI Meghan Olson is a 33 y.o. female that presents to the emergency department for evaluation of right sided dental pain since Friday. Patient states that tooth has a large cavity. Cold air hurts the tooth. Patient goes to the [facility] dental clinic but has not been in a while. She feels warm but has not checked her temperature. Patient has taken OTC pain medications without relief. She does not brush her teeth daily. No fever.    History reviewed. No pertinent past medical history.  There are no active problems to display for this patient.   Past Surgical History:  Procedure Laterality Date  . HERNIA REPAIR      Prior to Admission medications   Medication Sig Start Date End Date Taking? Authorizing Provider  clindamycin (CLEOCIN) 300 MG capsule Take 1 capsule (300 mg total) by mouth 3 (three) times daily for 10 days. 07/12/18 07/22/18  Enid Derry, PA-C  ibuprofen (ADVIL,MOTRIN) 600 MG tablet Take 1 tablet (600 mg total) by mouth every 6 (six) hours as needed. 07/12/18   Enid Derry, PA-C  lidocaine (XYLOCAINE) 2 % solution Use as directed 10 mLs in the mouth or throat as needed for mouth pain. 07/12/18   Enid Derry, PA-C  meloxicam (MOBIC) 15 MG tablet Take 1 tablet (15 mg total) by mouth daily. 03/27/18   Orvil Feil, PA-C  methocarbamol (ROBAXIN) 500 MG tablet Take 1 tablet (500 mg total) by mouth 4 (four) times daily. 06/06/16   Cuthriell, Delorise Royals, PA-C  nabumetone (RELAFEN) 750 MG tablet Take 1 tablet (750 mg total) by mouth 2 (two) times daily. 03/31/15   Menshew, Charlesetta Ivory, PA-C    Allergies Penicillins  No family history on file.  Social History Social History   Tobacco Use  . Smoking  status: Current Every Day Smoker    Packs/day: 0.50    Types: Cigarettes  . Smokeless tobacco: Never Used  Substance Use Topics  . Alcohol use: Yes    Comment: occas.  . Drug use: Yes     Review of Systems  Constitutional: No chills Cardiovascular: No chest pain. Respiratory:  No SOB. Gastrointestinal: No nausea, no vomiting.  Musculoskeletal: Negative for musculoskeletal pain. Skin: Negative for rash, abrasions, lacerations, ecchymosis. Neurological: Negative for headaches   ____________________________________________   PHYSICAL EXAM:  VITAL SIGNS: ED Triage Vitals  Enc Vitals Group     BP 07/12/18 0856 (!) 127/94     Pulse Rate 07/12/18 0856 67     Resp 07/12/18 0856 15     Temp 07/12/18 0856 98.8 F (37.1 C)     Temp Source 07/12/18 0856 Oral     SpO2 07/12/18 0856 97 %     Weight 07/12/18 0848 161 lb (73 kg)     Height 07/12/18 0848 5\' 4"  (1.626 m)     Head Circumference --      Peak Flow --      Pain Score 07/12/18 0848 10     Pain Loc --      Pain Edu? --      Excl. in GC? --      Constitutional: Alert and oriented. Well appearing and in no acute distress. Eyes: Conjunctivae are normal. PERRL. EOMI. Head: Atraumatic. ENT:  Ears:      Nose: No congestion/rhinnorhea.      Mouth/Throat: Mucous membranes are moist.  Large cavity to right bottom back molar.  Mild right-sided cheek swelling.  No difficulty opening or closing mouth.  No palpable abscess. Neck: No stridor.  Cardiovascular: Normal rate, regular rhythm.  Good peripheral circulation. Swelling to left cheek.  Respiratory: Normal respiratory effort without tachypnea or retractions. Lungs CTAB. Good air entry to the bases with no decreased or absent breath sounds. Musculoskeletal: Full range of motion to all extremities. No gross deformities appreciated. Neurologic:  Normal speech and language. No gross focal neurologic deficits are appreciated.  Skin:  Skin is warm, dry and intact. No rash  noted. Psychiatric: Mood and affect are normal. Speech and behavior are normal. Patient exhibits appropriate insight and judgement.   ____________________________________________   LABS (all labs ordered are listed, but only abnormal results are displayed)  Labs Reviewed - No data to display ____________________________________________  EKG   ____________________________________________  RADIOLOGY  No results found.  ____________________________________________    PROCEDURES  Procedure(s) performed:    Procedures    Medications  oxyCODONE-acetaminophen (PERCOCET/ROXICET) 5-325 MG per tablet 1 tablet (1 tablet Oral Given 07/12/18 1116)  lidocaine (XYLOCAINE) 2 % viscous mouth solution 15 mL (15 mLs Mouth/Throat Given 07/12/18 1116)     ____________________________________________   INITIAL IMPRESSION / ASSESSMENT AND PLAN / ED COURSE  Pertinent labs & imaging results that were available during my care of the patient were reviewed by me and considered in my medical decision making (see chart for details).  Review of the [place] CSRS was performed in accordance of the [facility] prior to dispensing any controlled drugs.   Patient's diagnosis is consistent with dental abscess. Patient will be discharged home with prescriptions for clindamycin.  Patient is allergic to amoxicillin.  Patient is to follow up with dentist as directed. Patient is given ED precautions to return to the ED for any worsening or new symptoms.     ____________________________________________  FINAL CLINICAL IMPRESSION(S) / ED DIAGNOSES  Final diagnoses:  Dental caries  Dental infection      NEW MEDICATIONS STARTED DURING THIS VISIT:  ED Discharge Orders         Ordered    clindamycin (CLEOCIN) 300 MG capsule  3 times daily     07/12/18 1108    ibuprofen (ADVIL,MOTRIN) 600 MG tablet  Every 6 hours PRN     07/12/18 1108    lidocaine (XYLOCAINE) 2 % solution  As needed     07/12/18 1108               This chart was dictated using voice recognition software/Dragon. Despite best efforts to proofread, errors can occur which can change the meaning. Any change was purely unintentional.    Enid Derry, PA-C 07/12/18 1311    Dionne Bucy, MD 07/12/18 [phone]

## 2018-11-16 ENCOUNTER — Emergency Department
Admission: EM | Admit: 2018-11-16 | Discharge: 2018-11-16 | Disposition: A | Discharge status: Home / Self Care | Payer: Medicaid Other | Attending: Emergency Medicine | Admitting: Emergency Medicine

## 2018-11-16 ENCOUNTER — Encounter: Payer: Self-pay | Admitting: Emergency Medicine

## 2018-11-16 ENCOUNTER — Other Ambulatory Visit: Payer: Self-pay

## 2018-11-16 DIAGNOSIS — H5711 Ocular pain, right eye: Secondary | ICD-10-CM | POA: Diagnosis present

## 2018-11-16 DIAGNOSIS — H1031 Unspecified acute conjunctivitis, right eye: Secondary | ICD-10-CM | POA: Diagnosis not present

## 2018-11-16 DIAGNOSIS — F17210 Nicotine dependence, cigarettes, uncomplicated: Secondary | ICD-10-CM | POA: Diagnosis not present

## 2018-11-16 MED ORDER — GENTAMICIN SULFATE 0.3 % OP SOLN: 1.0000 [drp] | Freq: Three times a day (TID) | OPHTHALMIC | 0 refills | Status: AC

## 2018-11-16 MED ORDER — FLUORESCEIN SODIUM 1 MG OP STRP
1.0000 | ORAL_STRIP | Freq: Once | OPHTHALMIC | Status: AC
  Administered 2018-11-16: 1 via OPHTHALMIC
  Filled 2018-11-16: qty 1

## 2018-11-16 MED ORDER — TETRACAINE HCL 0.5 % OP SOLN
1.0000 [drp] | Freq: Once | OPHTHALMIC | Status: AC
  Administered 2018-11-16: 1 [drp] via OPHTHALMIC
  Filled 2018-11-16: qty 4

## 2018-11-16 NOTE — ED Notes (Signed)
See triage note  States she developed some itching to right eye on Saturday  Then developed some drainage to eye on Sunday and Monday am

## 2018-11-16 NOTE — ED Provider Notes (Signed)
[facility] Department Provider Note  ____________________________________________  Time seen: Approximately 4:23 PM  I have reviewed the triage vital signs and the nursing notes.   HISTORY  Chief Complaint Eye Pain    HPI Meghan Olson is a 33 y.o. female presents to the emergency department with right eye irritation and purulent discharge for the past 4 days.  She has also had matting and crusting.  Patient also conveys that her eye also feels pruritic.  She denies pain with eye movement or blurry vision.  She does not currently wear contact lenses.  She has not experienced similar issues in the past.  No sick contacts in the home or place of work with similar symptoms.  No other alleviating measures have been attempted.        History reviewed. No pertinent past medical history.  There are no active problems to display for this patient.   Past Surgical History:  Procedure Laterality Date  . HERNIA REPAIR      Prior to Admission medications   Medication Sig Start Date End Date Taking? Authorizing Provider  gentamicin (GARAMYCIN) 0.3 % ophthalmic solution Place 1 drop into the right eye 3 (three) times daily for 10 days. 11/16/18 11/26/18  Orvil FeilWoods, Denee Boeder M, PA-C    Allergies Penicillins  History reviewed. No pertinent family history.  Social History Social History   Tobacco Use  . Smoking status: Current Every Day Smoker    Packs/day: 0.50    Types: Cigarettes  . Smokeless tobacco: Never Used  Substance Use Topics  . Alcohol use: Yes    Comment: occas.  . Drug use: Yes     Review of Systems  Constitutional: No fever/chills Eyes: Patient has purulent right eye discharge and conjunctivitis ENT: No upper respiratory complaints. Cardiovascular: no chest pain. Respiratory: no cough. No SOB. Gastrointestinal: No abdominal pain.  No nausea, no vomiting.  No diarrhea.  No constipation. Musculoskeletal: Negative for  musculoskeletal pain. Skin: Negative for rash, abrasions, lacerations, ecchymosis. Neurological: Negative for headaches, focal weakness or numbness.   ____________________________________________   PHYSICAL EXAM:  VITAL SIGNS: ED Triage Vitals  Enc Vitals Group     BP 11/16/18 1343 115/83     Pulse Rate 11/16/18 1343 83     Resp 11/16/18 1343 18     Temp 11/16/18 1343 98.3 F (36.8 C)     Temp Source 11/16/18 1343 Oral     SpO2 11/16/18 1343 100 %     Weight --      Height --      Head Circumference --      Peak Flow --      Pain Score 11/16/18 1338 8     Pain Loc --      Pain Edu? --      Excl. in GC? --      Constitutional: Alert and oriented. Well appearing and in no acute distress. Eyes: Right eye conjunctivitis visualized with matting and crusting in the eyelashes.  No fluorescein uptake visualized with right eye staining.  PERRL. EOMI. Head: Atraumatic. Cardiovascular: Normal rate, regular rhythm. Normal S1 and S2.  Good peripheral circulation. Respiratory: Normal respiratory effort without tachypnea or retractions. Lungs CTAB. Good air entry to the bases with no decreased or absent breath sounds. Skin:  Skin is warm, dry and intact. No rash noted. Psychiatric: Mood and affect are normal. Speech and behavior are normal. Patient exhibits appropriate insight and judgement.   ____________________________________________   LABS (all  labs ordered are listed, but only abnormal results are displayed)  Labs Reviewed - No data to display ____________________________________________  EKG   ____________________________________________  RADIOLOGY  No results found.  ____________________________________________    PROCEDURES  Procedure(s) performed:    Procedures    Medications  tetracaine (PONTOCAINE) 0.5 % ophthalmic solution 1 drop (1 drop Right Eye Given by Other 11/16/18 1514)  fluorescein ophthalmic strip 1 strip (1 strip Right Eye Given 11/16/18  1514)     ____________________________________________   INITIAL IMPRESSION / ASSESSMENT AND PLAN / ED COURSE  Pertinent labs & imaging results that were available during my care of the patient were reviewed by me and considered in my medical decision making (see chart for details).  Review of the [place] CSRS was performed in accordance of the [facility] prior to dispensing any controlled drugs.          Assessment and plan Right eye conjunctivitis 33 year old female presents to the emergency department with right eye conjunctivitis.  Patient was discharged with gentamicin drops and advised to follow-up with primary care as needed.  All patient questions were answered.    ____________________________________________  FINAL CLINICAL IMPRESSION(S) / ED DIAGNOSES  Final diagnoses:  Acute bacterial conjunctivitis of right eye      NEW MEDICATIONS STARTED DURING THIS VISIT:  ED Discharge Orders         Ordered    gentamicin (GARAMYCIN) 0.3 % ophthalmic solution  3 times daily     11/16/18 1512              This chart was dictated using voice recognition software/Dragon. Despite best efforts to proofread, errors can occur which can change the meaning. Any change was purely unintentional.    Orvil Feil, PA-C 11/16/18 1628    Jeanmarie Plant, MD 11/16/18 2213

## 2018-11-16 NOTE — ED Triage Notes (Signed)
Pt presents to ED via POV with c/o R eye infection. Pt with redness and mild swelling to R eye at this time.

## 2019-03-02 ENCOUNTER — Other Ambulatory Visit: Payer: Self-pay

## 2019-03-02 ENCOUNTER — Emergency Department
Admission: EM | Admit: 2019-03-02 | Discharge: 2019-03-02 | Disposition: A | Discharge status: Home / Self Care | Payer: Medicaid Other | Attending: Student in an Organized Health Care Education/Training Program | Admitting: Student in an Organized Health Care Education/Training Program

## 2019-03-02 ENCOUNTER — Encounter: Payer: Self-pay | Admitting: Emergency Medicine

## 2019-03-02 DIAGNOSIS — F10920 Alcohol use, unspecified with intoxication, uncomplicated: Secondary | ICD-10-CM | POA: Insufficient documentation

## 2019-03-02 DIAGNOSIS — F17210 Nicotine dependence, cigarettes, uncomplicated: Secondary | ICD-10-CM | POA: Diagnosis not present

## 2019-03-02 DIAGNOSIS — R55 Syncope and collapse: Secondary | ICD-10-CM | POA: Diagnosis present

## 2019-03-02 LAB — COMPREHENSIVE METABOLIC PANEL
ALT: 63 U/L — ABNORMAL HIGH (ref 0–44)
AST: 66 U/L — ABNORMAL HIGH (ref 15–41)
Albumin: 3.5 g/dL (ref 3.5–5.0)
Alkaline Phosphatase: 50 U/L (ref 38–126)
Anion gap: 7 (ref 5–15)
BUN: 7 mg/dL (ref 6–20)
CO2: 22 mmol/L (ref 22–32)
Calcium: 8 mg/dL — ABNORMAL LOW (ref 8.9–10.3)
Chloride: 111 mmol/L (ref 98–111)
Creatinine, Ser: 0.62 mg/dL (ref 0.44–1.00)
GFR calc Af Amer: >60 mL/min (ref >60)
GFR calc non Af Amer: >60 mL/min (ref >60)
Glucose, Bld: 76 mg/dL (ref 70–99)
Potassium: 2.9 mmol/L — ABNORMAL LOW (ref 3.5–5.1)
Sodium: 140 mmol/L (ref 135–145)
Total Bilirubin: 0.3 mg/dL (ref 0.3–1.2)
Total Protein: 6.9 g/dL (ref 6.5–8.1)

## 2019-03-02 LAB — CBC
HCT: 37.3 % (ref 36.0–46.0)
Hemoglobin: 12.4 g/dL (ref 12.0–15.0)
MCH: 32.1 pg (ref 26.0–34.0)
MCHC: 33.2 g/dL (ref 30.0–36.0)
MCV: 96.6 fL (ref 80.0–100.0)
Platelets: 182 10*3/uL (ref 150–400)
RBC: 3.86 MIL/uL — ABNORMAL LOW (ref 3.87–5.11)
RDW: 13.1 % (ref 11.5–15.5)
WBC: 8.8 10*3/uL (ref 4.0–10.5)
nRBC: 0 % (ref 0.0–0.2)

## 2019-03-02 LAB — ETHANOL: Alcohol, Ethyl (B): 151 mg/dL — ABNORMAL HIGH (ref <10)

## 2019-03-02 NOTE — ED Notes (Signed)
Pt awaking asking this RN "what happened". This RN explained to pt that she was brought in by ems for being unresponsive. Pt states "I don't remember what happened". This RN asked pt if there was any family that she would like me to call and update. Pt denied at this time. Pt requesting to go back to sleep at this time. This RN will continue to monitor for further needs.

## 2019-03-02 NOTE — ED Notes (Signed)
This RN notified by Radonna Ricker, RN that patient requesting to speak with this RN. Upon this RN arrival to bedside, patient back to sleep, respirations even and unlabored. Will continue to monitor for further patient needs and await for patient to re-awaken to answer questions.

## 2019-03-02 NOTE — ED Notes (Addendum)
This RN to bedside due to patient calling out again, pt states she feels confused, is oriented to place, disoriented to situation. This RN explained to patient what happened, gave patient her bag that was sitting beside the bed, pt states "where's my money and shit?" This RN explained all patient belongings were dress and bag beside bed, pt verified her money was in black bag beside bed. Pt denies wanting this RN to call anyone for her. This RN explained patient would need a sober ride to be discharged.

## 2019-03-02 NOTE — ED Triage Notes (Signed)
Pt presents via acems with c/o unconsciousness. Per EMS a bystander saw pt stumbling while walking on the side of the road and picked pt up. Bystander reported that pt passed out in car so he called ems. Pt will wake up to voice. Pt with slurred speech at this time. MD Paduchowski at bedside.

## 2019-03-02 NOTE — ED Provider Notes (Signed)
Patient clinically sober.  Hemodynamically stable.  Able to ambulate with steady gait and tolerating oral hydration.  Stable for outpatient follow-up.   Merlyn Lot, MD 03/02/19 [phone]

## 2019-03-02 NOTE — ED Notes (Signed)
NAD noted at time of D/C. Pt denies questions or concerns. Pt ambulatory to the lobby at this time. Pt refused wheelchair to the lobby. Pt D/C into the care of her sister as a sober ride for safe discharge.

## 2019-03-02 NOTE — ED Provider Notes (Signed)
[facility] Emergency Department Provider Note  Time seen: 4:01 AM  I have reviewed the triage vital signs and the nursing notes.   HISTORY  Chief Complaint Loss of Consciousness   HPI Meghan Olson is a 33 y.o. female with no significant past medical history presents to the emergency department by EMS for being unresponsive/decreased responsiveness.  According to EMS report patient was found stumbling down the side of the road and was picked up by a bystander.  However the patient passed out in the bystanders car before she could tell him where she was going.  He could not get her to wake up so he called 911.  EMS states police were on scene as well and they ultimately decided to transport her to the emergency department as she was having significant difficulty staying awake.  Upon arrival patient is somnolent but awakens to mild physical stimuli or strong verbal stimuli.  Will answer several questions and then falls back asleep.  Patient appears to be intoxicated.   No past medical history on file.  There are no active problems to display for this patient.   Past Surgical History:  Procedure Laterality Date  . HERNIA REPAIR      Prior to Admission medications   Not on File    Allergies  Allergen Reactions  . Penicillins Hives    No family history on file.  Social History Social History   Tobacco Use  . Smoking status: Current Every Day Smoker    Packs/day: 0.50    Types: Cigarettes  . Smokeless tobacco: Never Used  Substance Use Topics  . Alcohol use: Yes    Comment: occas.  . Drug use: Yes    Review of Systems Unable to obtain an adequate shows accurate review of systems secondary to altered mental status.  ____________________________________________   PHYSICAL EXAM:  Constitutional: Somnolent, awakens to light physical stimuli.  No distress. Eyes: 2 mm, equal and reactive bilaterally. ENT      Head: Normocephalic and  atraumatic.      Mouth/Throat: Mucous membranes are moist.  Dark appearing liquid on lips, possibly black lipstick. Cardiovascular: Normal rate, regular rhythm Respiratory: Normal respiratory effort without tachypnea nor retractions. Breath sounds are clear  Gastrointestinal: Soft and nontender. No distention.   Musculoskeletal: Nontender with normal range of motion in all extremities. . Neurologic: Somewhat slurred speech when speaking.  Appears to be able to move all extremities at times although is not following commands. Skin:  Skin is warm, dry and intact.  Psychiatric: Somnolent.  ____________________________________________   INITIAL IMPRESSION / ASSESSMENT AND PLAN / ED COURSE  Pertinent labs & imaging results that were available during my care of the patient were reviewed by me and considered in my medical decision making (see chart for details).   Patient presents to the emergency department for decreased responsiveness.  Differential would include intoxication, alcohol intoxication, substance use, metabolic or electrolyte abnormality, less likely head injury given no signs of trauma.  We will check labs including ethanol level and continue to closely monitor in the emergency department.  Patient's alcohol level is 151.  Mild hypokalemia, mild LFT elevation.  Patient is sleeping comfortably.  We will likely discharge once the patient is awake and clinically sober.  Patient care signed out to oncoming physician.  Hannelore Bova Hoeg was evaluated in Emergency Department on 03/02/2019 for the symptoms described in the history of present illness. She was evaluated in the context of the [place] COVID-19  pandemic, which necessitated consideration that the patient might be at risk for infection with the SARS-CoV-2 virus that causes COVID-19. Institutional protocols and algorithms that pertain to the evaluation of patients at risk for COVID-19 are in a state of rapid change based on  information released by regulatory bodies including the CDC and federal and state organizations. These policies and algorithms were followed during the patient's care in the ED.  ____________________________________________   FINAL CLINICAL IMPRESSION(S) / ED DIAGNOSES  Alcohol intoxication   Minna AntisPaduchowski, Claudetta Sallie, MD 03/02/19 [phone]

## 2019-11-07 ENCOUNTER — Encounter: Payer: Self-pay | Admitting: Emergency Medicine

## 2019-11-07 ENCOUNTER — Emergency Department: Payer: Medicaid Other

## 2019-11-07 ENCOUNTER — Other Ambulatory Visit: Payer: Self-pay

## 2019-11-07 ENCOUNTER — Emergency Department
Admission: EM | Admit: 2019-11-07 | Discharge: 2019-11-07 | Disposition: A | Discharge status: Home / Self Care | Payer: Medicaid Other | Attending: Emergency Medicine | Admitting: Emergency Medicine

## 2019-11-07 DIAGNOSIS — L0291 Cutaneous abscess, unspecified: Secondary | ICD-10-CM

## 2019-11-07 DIAGNOSIS — J869 Pyothorax without fistula: Secondary | ICD-10-CM

## 2019-11-07 DIAGNOSIS — F17210 Nicotine dependence, cigarettes, uncomplicated: Secondary | ICD-10-CM | POA: Insufficient documentation

## 2019-11-07 DIAGNOSIS — N611 Abscess of the breast and nipple: Secondary | ICD-10-CM | POA: Diagnosis not present

## 2019-11-07 DIAGNOSIS — N6322 Unspecified lump in the left breast, upper inner quadrant: Secondary | ICD-10-CM | POA: Diagnosis present

## 2019-11-07 MED ORDER — HYDROCODONE-ACETAMINOPHEN 5-325 MG PO TABS: 1.0000 | ORAL_TABLET | Freq: Four times a day (QID) | ORAL | 0 refills | Status: DC | PRN

## 2019-11-07 MED ORDER — LIDOCAINE HCL (PF) 1 % IJ SOLN
5.0000 mL | Freq: Once | INTRAMUSCULAR | Status: AC
  Administered 2019-11-07: 5 mL via INTRADERMAL
  Filled 2019-11-07: qty 5

## 2019-11-07 MED ORDER — SULFAMETHOXAZOLE-TRIMETHOPRIM 800-160 MG PO TABS
1.0000 | ORAL_TABLET | Freq: Two times a day (BID) | ORAL | 0 refills | Status: DC
Start: 2019-11-07 — End: 2021-12-12

## 2019-11-07 NOTE — ED Triage Notes (Signed)
Pt reports abscess to her left chest for the past few days. Pt states usually gets boils but they are usually under her arm and not her chest.

## 2019-11-07 NOTE — ED Provider Notes (Signed)
[facility] Emergency Department Provider Note  ____________________________________________   First MD Initiated Contact with Patient 11/07/19 1412     (approximate)  I have reviewed the triage vital signs and the nursing notes.   HISTORY  Chief Complaint Abscess    HPI Jancy Sprankle Ramberg is a 34 y.o. female presents emergency department with a questionable boil on the left breast.  Patient states it has been there for about 5 days although she has had a hard area there for several years.  No fever or chills.  No drainage.  Patient has a history of multiple abscesses.   No family history of breast cancer   History reviewed. No pertinent past medical history.  There are no problems to display for this patient.   Past Surgical History:  Procedure Laterality Date  . HERNIA REPAIR      Prior to Admission medications   Medication Sig Start Date End Date Taking? Authorizing Provider  HYDROcodone-acetaminophen (NORCO/VICODIN) 5-325 MG tablet Take 1 tablet by mouth every 6 (six) hours as needed for moderate pain. 11/07/19   Deolinda Frid, Linden Dolin, PA-C  sulfamethoxazole-trimethoprim (BACTRIM DS) 800-160 MG tablet Take 1 tablet by mouth 2 (two) times daily. 11/07/19   Versie Starks, PA-C    Allergies Penicillins  History reviewed. No pertinent family history.  Social History Social History   Tobacco Use  . Smoking status: Current Every Day Smoker    Packs/day: 0.50    Types: Cigarettes  . Smokeless tobacco: Never Used  Substance Use Topics  . Alcohol use: Yes    Comment: occas.  . Drug use: Yes    Review of Systems  Constitutional: No fever/chills Eyes: No visual changes. ENT: No sore throat. Respiratory: Denies cough Genitourinary: Negative for dysuria. Musculoskeletal: Negative for back pain. Skin: Negative for rash. Psychiatric: no mood changes,     ____________________________________________   PHYSICAL EXAM:  VITAL SIGNS: ED  Triage Vitals  Enc Vitals Group     BP 11/07/19 1343 122/78     Pulse Rate 11/07/19 1343 88     Resp 11/07/19 1343 20     Temp 11/07/19 1343 98.3 F (36.8 C)     Temp Source 11/07/19 1343 Oral     SpO2 11/07/19 1343 98 %     Weight 11/07/19 1339 175 lb (79.4 kg)     Height 11/07/19 1339 5\' 4"  (1.626 m)     Head Circumference --      Peak Flow --      Pain Score 11/07/19 1338 8     Pain Loc --      Pain Edu? --      Excl. in [address]? --     Constitutional: Alert and oriented. Well appearing and in no acute distress. Eyes: Conjunctivae are normal.  Head: Atraumatic. Nose: No congestion/rhinnorhea. Mouth/Throat: Mucous membranes are moist.   Neck:  supple no lymphadenopathy noted Cardiovascular: Normal rate, regular rhythm. Heart sounds are normal Respiratory: Normal respiratory effort.  No retractions, lungs c t a  Breast: Left breast has a $0.50 piece sized raised hard red lesion at the left upper inner quadrant GU: deferred Musculoskeletal: FROM all extremities, warm and well perfused Neurologic:  Normal speech and language.  Skin:  Skin is warm, dry and intact. No rash noted. Psychiatric: Mood and affect are normal. Speech and behavior are normal.  ____________________________________________   LABS (all labs ordered are listed, but only abnormal results are displayed)  Labs Reviewed - No  data to display ____________________________________________   ____________________________________________  RADIOLOGY  Ultrasound chest for abscess shows a probable sebaceous cyst   ____________________________________________   PROCEDURES  Procedure(s) performed:   Marland KitchenMarland KitchenIncision and Drainage  Date/Time: 11/07/2019 5:09 PM Performed by: Faythe Ghee, PA-C Authorized by: Faythe Ghee, PA-C   Consent:    Consent obtained:  Verbal   Consent given by:  Patient   Risks discussed:  Bleeding, incomplete drainage, pain, damage to other organs and infection   Alternatives  discussed:  No treatment Location:    Type:  Abscess   Location:  Trunk   Trunk location:  Chest Pre-procedure details:    Skin preparation:  Betadine Anesthesia (see MAR for exact dosages):    Anesthesia method:  Local infiltration   Local anesthetic:  Lidocaine 1% w/o epi Procedure type:    Complexity:  Simple Procedure details:    Incision types:  Stab incision   Incision depth:  Dermal   Scalpel blade:  11   Wound management:  Probed and deloculated   Drainage:  Purulent   Drainage amount:  Copious   Wound treatment:  Drain placed   Packing materials:  1/4 in iodoform gauze Post-procedure details:    Patient tolerance of procedure:  Tolerated well, no immediate complications      ____________________________________________   INITIAL IMPRESSION / ASSESSMENT AND PLAN / ED COURSE  Pertinent labs & imaging results that were available during my care of the patient were reviewed by me and considered in my medical decision making (see chart for details).   Patient is 34 year old female presents emergency department concerns of a breast abscess.  See HPI.  Physical exam shows a hardened area that is red on the upper portion of the left breast.  Ordered an ultrasound of the left breast   Ultrasound showed that the area of concern is a cyst most likely sebaceous cyst.  I did explain findings to the patient.  Informed her that would be able to IND this here in the ED.  See procedure note for incision and drainage.  Patient tolerated procedure well.  We gave her a prescription for Septra and Vicodin she is to return to the emergency department in 2 days for wound check and packing removal.  Return if worsening or trouble breathing.  Feel patient stable and appears well so will be able to be sent home with no difficulty.  She was discharged in stable condition.  Roshni Burbano Gunderman was evaluated in Emergency Department on 11/07/2019 for the symptoms described in the history of  present illness. She was evaluated in the context of the global COVID-19 pandemic, which necessitated consideration that the patient might be at risk for infection with the SARS-CoV-2 virus that causes COVID-19. Institutional protocols and algorithms that pertain to the evaluation of patients at risk for COVID-19 are in a state of rapid change based on information released by regulatory bodies including the CDC and federal and state organizations. These policies and algorithms were followed during the patient's care in the ED.   As part of my medical decision making, I reviewed the following data within the electronic MEDICAL RECORD NUMBER Nursing notes reviewed and incorporated, Old chart reviewed, Radiograph reviewed , Notes from prior ED visits and [place] Controlled Substance Database  ____________________________________________   FINAL CLINICAL IMPRESSION(S) / ED DIAGNOSES  Final diagnoses:  Abscess  Abscess of chest (HCC)      NEW MEDICATIONS STARTED DURING THIS VISIT:  New Prescriptions   HYDROCODONE-ACETAMINOPHEN (  NORCO/VICODIN) 5-325 MG TABLET    Take 1 tablet by mouth every 6 (six) hours as needed for moderate pain.   SULFAMETHOXAZOLE-TRIMETHOPRIM (BACTRIM DS) 800-160 MG TABLET    Take 1 tablet by mouth 2 (two) times daily.     Note:  This document was prepared using Dragon voice recognition software and may include unintentional dictation errors.    Faythe Ghee, PA-C 11/07/19 1711    Arnaldo Natal, MD 11/10/19 1525

## 2019-11-07 NOTE — Discharge Instructions (Addendum)
Return to the emergency department in 2 days for packing removal and wound recheck.  If the packing falls out you may wash the wound carefully while in the shower.  Take the antibiotic as prescribed.  Pain medication as needed.  May also try over-the-counter ibuprofen.  Warm compresses will help soften the skin around the incision.

## 2019-11-07 NOTE — ED Notes (Signed)
See triage note  Presents with possible abscess area to left upper chest   States she has had an area there for couple of years  But now area is larger and more tender
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
## 2020-10-27 ENCOUNTER — Encounter: Payer: Self-pay | Admitting: Emergency Medicine

## 2020-10-27 ENCOUNTER — Emergency Department
Admission: EM | Admit: 2020-10-27 | Discharge: 2020-10-27 | Disposition: A | Discharge status: Home / Self Care | Payer: Medicaid Other | Attending: Emergency Medicine | Admitting: Emergency Medicine

## 2020-10-27 ENCOUNTER — Emergency Department: Payer: Medicaid Other

## 2020-10-27 ENCOUNTER — Other Ambulatory Visit: Payer: Self-pay

## 2020-10-27 DIAGNOSIS — R509 Fever, unspecified: Secondary | ICD-10-CM | POA: Diagnosis present

## 2020-10-27 DIAGNOSIS — R002 Palpitations: Secondary | ICD-10-CM | POA: Diagnosis not present

## 2020-10-27 DIAGNOSIS — Z20822 Contact with and (suspected) exposure to covid-19: Secondary | ICD-10-CM | POA: Insufficient documentation

## 2020-10-27 DIAGNOSIS — F17210 Nicotine dependence, cigarettes, uncomplicated: Secondary | ICD-10-CM | POA: Insufficient documentation

## 2020-10-27 DIAGNOSIS — K529 Noninfective gastroenteritis and colitis, unspecified: Secondary | ICD-10-CM | POA: Insufficient documentation

## 2020-10-27 LAB — CBC WITH DIFFERENTIAL/PLATELET
Abs Immature Granulocytes: 0.02 10*3/uL (ref 0.00–0.07)
Basophils Absolute: 0 10*3/uL (ref 0.0–0.1)
Basophils Relative: 0 %
Eosinophils Absolute: 0 10*3/uL (ref 0.0–0.5)
Eosinophils Relative: 0 %
HCT: 45.4 % (ref 36.0–46.0)
Hemoglobin: 15.4 g/dL — ABNORMAL HIGH (ref 12.0–15.0)
Immature Granulocytes: 0 %
Lymphocytes Relative: 26 %
Lymphs Abs: 2.2 10*3/uL (ref 0.7–4.0)
MCH: 33.1 pg (ref 26.0–34.0)
MCHC: 33.9 g/dL (ref 30.0–36.0)
MCV: 97.6 fL (ref 80.0–100.0)
Monocytes Absolute: 1 10*3/uL (ref 0.1–1.0)
Monocytes Relative: 13 %
Neutro Abs: 4.9 10*3/uL (ref 1.7–7.7)
Neutrophils Relative %: 61 %
Platelets: 195 10*3/uL (ref 150–400)
RBC: 4.65 MIL/uL (ref 3.87–5.11)
RDW: 12.6 % (ref 11.5–15.5)
WBC: 8.2 10*3/uL (ref 4.0–10.5)
nRBC: 0 % (ref 0.0–0.2)

## 2020-10-27 LAB — URINALYSIS, COMPLETE (UACMP) WITH MICROSCOPIC
Bacteria, UA: NONE SEEN
Glucose, UA: NEGATIVE mg/dL
Ketones, ur: 80 mg/dL — AB
Nitrite: NEGATIVE
Protein, ur: 100 mg/dL — AB
Specific Gravity, Urine: 1.035 — ABNORMAL HIGH (ref 1.005–1.030)
pH: 5 (ref 5.0–8.0)

## 2020-10-27 LAB — COMPREHENSIVE METABOLIC PANEL
ALT: 44 U/L (ref 0–44)
AST: 51 U/L — ABNORMAL HIGH (ref 15–41)
Albumin: 4.3 g/dL (ref 3.5–5.0)
Alkaline Phosphatase: 69 U/L (ref 38–126)
Anion gap: 14 (ref 5–15)
BUN: 20 mg/dL (ref 6–20)
CO2: 24 mmol/L (ref 22–32)
Calcium: 10.2 mg/dL (ref 8.9–10.3)
Chloride: 93 mmol/L — ABNORMAL LOW (ref 98–111)
Creatinine, Ser: 0.79 mg/dL (ref 0.44–1.00)
GFR, Estimated: >60 mL/min (ref >60)
Glucose, Bld: 97 mg/dL (ref 70–99)
Potassium: 3.9 mmol/L (ref 3.5–5.1)
Sodium: 131 mmol/L — ABNORMAL LOW (ref 135–145)
Total Bilirubin: 1.6 mg/dL — ABNORMAL HIGH (ref 0.3–1.2)
Total Protein: 8.9 g/dL — ABNORMAL HIGH (ref 6.5–8.1)

## 2020-10-27 LAB — LACTIC ACID, PLASMA: Lactic Acid, Venous: 1.1 mmol/L (ref 0.5–1.9)

## 2020-10-27 LAB — RESP PANEL BY RT-PCR (FLU A&B, COVID) ARPGX2
Influenza A by PCR: NEGATIVE
Influenza B by PCR: NEGATIVE
SARS Coronavirus 2 by RT PCR: NEGATIVE

## 2020-10-27 LAB — TROPONIN I (HIGH SENSITIVITY)
Troponin I (High Sensitivity): 25 ng/L — ABNORMAL HIGH (ref <18)
Troponin I (High Sensitivity): 21 ng/L — ABNORMAL HIGH (ref <18)

## 2020-10-27 LAB — PREGNANCY, URINE: Preg Test, Ur: NEGATIVE

## 2020-10-27 MED ORDER — SODIUM CHLORIDE 0.9 % IV SOLN
1000.0000 mL | Freq: Once | INTRAVENOUS | Status: AC
  Administered 2020-10-27: 1000 mL via INTRAVENOUS

## 2020-10-27 MED ORDER — ONDANSETRON 4 MG PO TBDP: 4.0000 mg | ORAL_TABLET | Freq: Three times a day (TID) | ORAL | 0 refills | Status: DC | PRN

## 2020-10-27 MED ORDER — ONDANSETRON HCL 4 MG/2ML IJ SOLN
4.0000 mg | Freq: Once | INTRAMUSCULAR | Status: AC
  Administered 2020-10-27: 4 mg via INTRAVENOUS
  Filled 2020-10-27: qty 2

## 2020-10-27 NOTE — ED Notes (Signed)
Pt c/o N/V/D starting Friday morning. Pt also reports subjective fever with chills since Friday, but was not able to take her temp at home.  Pt also reports body aches, productive cough.  Denies recent sick exposure. Pt reports she has a normal "smoker's cough" but her phlegm is darker than usual.

## 2020-10-27 NOTE — ED Triage Notes (Signed)
Pt via POV from home. Pt c/o fever, emesis, diarrhea, chills, and palpitations that started on Friday. Pt states that she have not actually taken her temp but has felt hot. Pt states that she took Tylenol around 01:30pm. Pt states she is unable to keep anything down.

## 2020-10-27 NOTE — ED Notes (Signed)
Verified that lab received blood from triage.

## 2020-10-27 NOTE — ED Notes (Signed)
ED Provider at bedside. 

## 2020-10-27 NOTE — ED Provider Notes (Signed)
[facility] Emergency Department Provider Note ____________________________________________   Event Date/Time   First MD Initiated Contact with Patient 10/27/20 1836     (approximate)  I have reviewed the triage vital signs and the nursing notes.   HISTORY  Chief Complaint Emesis, Fever, and Palpitations  HPI Meghan Olson is a 35 y.o. female with no significant past medical history presents to the emergency department for treatment and evaluation of fever, vomiting, diarrhea, chills, and palpitations x 2 days. Unable to tolerate food or fluids including Tylenol.      No past medical history on file.  There are no problems to display for this patient.   Past Surgical History:  Procedure Laterality Date  . HERNIA REPAIR      Prior to Admission medications   Medication Sig Start Date End Date Taking? Authorizing Provider  ondansetron (ZOFRAN ODT) 4 MG disintegrating tablet Take 1 tablet (4 mg total) by mouth every 8 (eight) hours as needed. 10/27/20  Yes Jene Every, MD  HYDROcodone-acetaminophen (NORCO/VICODIN) 5-325 MG tablet Take 1 tablet by mouth every 6 (six) hours as needed for moderate pain. 11/07/19   Fisher, Roselyn Bering, PA-C  sulfamethoxazole-trimethoprim (BACTRIM DS) 800-160 MG tablet Take 1 tablet by mouth 2 (two) times daily. 11/07/19   Faythe Ghee, PA-C    Allergies Penicillins  No family history on file.  Social History Social History   Tobacco Use  . Smoking status: Current Every Day Smoker    Packs/day: 2.00    Types: Cigarettes  . Smokeless tobacco: Never Used  Substance Use Topics  . Alcohol use: Yes    Comment: occas.  . Drug use: Yes    Review of Systems  Constitutional: Positive for fever/chills Eyes: No visual changes. ENT: No sore throat. Cardiovascular: Denies chest pain. Respiratory: Denies shortness of breath. Gastrointestinal: No abdominal pain.  Positive for nausea and vomiting.  No diarrhea.   No constipation. Genitourinary: Negative for dysuria. Musculoskeletal: Negative for back pain. Skin: Negative for rash. Neurological: Negative for headaches, focal weakness or numbness. ____________________________________________   PHYSICAL EXAM:  VITAL SIGNS: ED Triage Vitals  Enc Vitals Group     BP 10/27/20 1807 (!) 129/99     Pulse Rate 10/27/20 1807 (!) 135     Resp 10/27/20 1807 20     Temp 10/27/20 1807 98.6 F (37 C)     Temp Source 10/27/20 1807 Oral     SpO2 10/27/20 1807 99 %     Weight 10/27/20 1808 130 lb (59 kg)     Height 10/27/20 1808 5\' 4"  (1.626 m)     Head Circumference --      Peak Flow --      Pain Score 10/27/20 1807 6     Pain Loc --      Pain Edu? --      Excl. in [place]? --     Constitutional: Alert and oriented. Well appearing and in no acute distress. Eyes: Conjunctivae are normal. Head: Atraumatic. Nose: No congestion/rhinnorhea. Mouth/Throat: Mucous membranes are moist. Oropharynx non-erythematous. Neck: No stridor.   Hematological/Lymphatic/Immunilogical: No cervical lymphadenopathy. Cardiovascular: Tachycardia. Grossly normal heart sounds.  Good peripheral circulation. Respiratory: Normal respiratory effort.  No retractions. Lungs CTAB. Gastrointestinal: Soft and nontender. No distention. No abdominal bruits. No CVA tenderness. Genitourinary:  Musculoskeletal: No lower extremity tenderness nor edema.  No joint effusions. Neurologic:  Normal speech and language. No gross focal neurologic deficits are appreciated. No gait instability. Skin:  Skin  is warm, dry and intact. No rash noted. Psychiatric: Mood and affect are normal. Speech and behavior are normal.  ____________________________________________   LABS (all labs ordered are listed, but only abnormal results are displayed)  Labs Reviewed  COMPREHENSIVE METABOLIC PANEL - Abnormal; Notable for the following components:      Result Value   Sodium 131 (*)    Chloride 93 (*)    Total  Protein 8.9 (*)    AST 51 (*)    Total Bilirubin 1.6 (*)    All other components within normal limits  CBC WITH DIFFERENTIAL/PLATELET - Abnormal; Notable for the following components:   Hemoglobin 15.4 (*)    All other components within normal limits  URINALYSIS, COMPLETE (UACMP) WITH MICROSCOPIC - Abnormal; Notable for the following components:   Color, Urine AMBER (*)    APPearance CLOUDY (*)    Specific Gravity, Urine 1.035 (*)    Hgb urine dipstick MODERATE (*)    Bilirubin Urine SMALL (*)    Ketones, ur 80 (*)    Protein, ur 100 (*)    Leukocytes,Ua SMALL (*)    All other components within normal limits  TROPONIN I (HIGH SENSITIVITY) - Abnormal; Notable for the following components:   Troponin I (High Sensitivity) 25 (*)    All other components within normal limits  TROPONIN I (HIGH SENSITIVITY) - Abnormal; Notable for the following components:   Troponin I (High Sensitivity) 21 (*)    All other components within normal limits  RESP PANEL BY RT-PCR (FLU A&B, COVID) ARPGX2  LACTIC ACID, PLASMA  PREGNANCY, URINE  POC SARS CORONAVIRUS 2 AG -  ED   ____________________________________________  EKG  ED ECG REPORT I, Layza Summa, FNP-BC personally viewed and interpreted this ECG.   Date: 10/27/2020  EKG Time: 1813  Rate: 140  Rhythm: sinus tachycardia  Axis: normal  Intervals:none  ST&T Change: no ST elevation or depression  ____________________________________________  RADIOLOGY  ED MD interpretation:    No acute cardiopulmonary abnormality on chest x-ray.  I, Kem Boroughs, personally viewed and evaluated these images (plain radiographs) as part of my medical decision making, as well as reviewing the written report by the radiologist.  Official radiology report(s): No results found.  ____________________________________________   PROCEDURES  Procedure(s) performed (including Critical  Care):  Procedures  ____________________________________________   INITIAL IMPRESSION / ASSESSMENT AND PLAN     35 year old female presenting to the emergency department for treatment and evaluation of nausea, vomiting and palpitations.  See HPI for further details.  Patient estimates that she has vomited 25 or more times.  No known sick contacts.  Subjective fever DIFFERENTIAL DIAGNOSIS  COVID-19, influenza, viral gastritis, gastroenteritis.  ED COURSE  Initial troponin slightly elevated at 25, likely due to tachycardia. She denies chest pain. Will trend.    Clinical Course as of 10/29/20 1934  Sun Oct 27, 2020  2013 Mild hyponatremia, AST mildly elevated at 51, total bilirubin is elevated at 1.6, normal white blood cell count, and normal chest x-ray.  IV fluids infusing and heart rate has now come down. She is complaining of nausea. Zofran ordered. Repeat troponin and urinalysis are pending. [CT]  2100 Awaiting result of second troponin. Dr. Cyril Loosen will assume care and follow to disposition. [CT]    Clinical Course User Index [CT] Natalee Tomkiewicz B, FNP   ___________________________________________   FINAL CLINICAL IMPRESSION(S) / ED DIAGNOSES  Final diagnoses:  Gastroenteritis     ED Discharge Orders  Ordered    ondansetron (ZOFRAN ODT) 4 MG disintegrating tablet  Every 8 hours PRN        10/27/20 2139           Carely Nappier Arias was evaluated in Emergency Department on 10/29/2020 for the symptoms described in the history of present illness. She was evaluated in the context of the global COVID-19 pandemic, which necessitated consideration that the patient might be at risk for infection with the SARS-CoV-2 virus that causes COVID-19. Institutional protocols and algorithms that pertain to the evaluation of patients at risk for COVID-19 are in a state of rapid change based on information released by regulatory bodies including the CDC and federal and state  organizations. These policies and algorithms were followed during the patient's care in the ED.   Note:  This document was prepared using Dragon voice recognition software and may include unintentional dictation errors.   Chinita Pester, FNP 10/29/20 1934    Jene Every, MD 10/31/20 [phone]

## 2020-10-27 NOTE — ED Notes (Signed)
Pt. returned from XR. 

## 2021-02-07 ENCOUNTER — Other Ambulatory Visit: Payer: Self-pay

## 2021-02-07 ENCOUNTER — Emergency Department
Admission: EM | Admit: 2021-02-07 | Discharge: 2021-02-08 | Disposition: A | Discharge status: Home / Self Care | Payer: Medicaid Other | Attending: Emergency Medicine | Admitting: Emergency Medicine

## 2021-02-07 DIAGNOSIS — R112 Nausea with vomiting, unspecified: Secondary | ICD-10-CM | POA: Diagnosis present

## 2021-02-07 DIAGNOSIS — K529 Noninfective gastroenteritis and colitis, unspecified: Secondary | ICD-10-CM | POA: Diagnosis not present

## 2021-02-07 DIAGNOSIS — E872 Acidosis: Secondary | ICD-10-CM

## 2021-02-07 DIAGNOSIS — F17210 Nicotine dependence, cigarettes, uncomplicated: Secondary | ICD-10-CM | POA: Insufficient documentation

## 2021-02-07 DIAGNOSIS — E8729 Other acidosis: Secondary | ICD-10-CM

## 2021-02-07 LAB — CBC
HCT: 45.2 % (ref 36.0–46.0)
Hemoglobin: 15.3 g/dL — ABNORMAL HIGH (ref 12.0–15.0)
MCH: 34.5 pg — ABNORMAL HIGH (ref 26.0–34.0)
MCHC: 33.8 g/dL (ref 30.0–36.0)
MCV: 102 fL — ABNORMAL HIGH (ref 80.0–100.0)
Platelets: 249 10*3/uL (ref 150–400)
RBC: 4.43 MIL/uL (ref 3.87–5.11)
RDW: 12.8 % (ref 11.5–15.5)
WBC: 8.4 10*3/uL (ref 4.0–10.5)
nRBC: 0 % (ref 0.0–0.2)

## 2021-02-07 LAB — COMPREHENSIVE METABOLIC PANEL
ALT: 43 U/L (ref 0–44)
AST: 65 U/L — ABNORMAL HIGH (ref 15–41)
Albumin: 4.5 g/dL (ref 3.5–5.0)
Alkaline Phosphatase: 67 U/L (ref 38–126)
Anion gap: 17 — ABNORMAL HIGH (ref 5–15)
BUN: <5 mg/dL — ABNORMAL LOW (ref 6–20)
CO2: 13 mmol/L — ABNORMAL LOW (ref 22–32)
Calcium: 9.7 mg/dL (ref 8.9–10.3)
Chloride: 104 mmol/L (ref 98–111)
Creatinine, Ser: 0.65 mg/dL (ref 0.44–1.00)
GFR, Estimated: >60 mL/min (ref >60)
Glucose, Bld: 85 mg/dL (ref 70–99)
Potassium: 4.5 mmol/L (ref 3.5–5.1)
Sodium: 134 mmol/L — ABNORMAL LOW (ref 135–145)
Total Bilirubin: 1.4 mg/dL — ABNORMAL HIGH (ref 0.3–1.2)
Total Protein: 8.8 g/dL — ABNORMAL HIGH (ref 6.5–8.1)

## 2021-02-07 MED ORDER — ONDANSETRON HCL 4 MG/2ML IJ SOLN
4.0000 mg | Freq: Once | INTRAMUSCULAR | Status: AC
  Administered 2021-02-08: 4 mg via INTRAVENOUS
  Filled 2021-02-07: qty 2

## 2021-02-07 MED ORDER — LACTATED RINGERS IV BOLUS
1000.0000 mL | Freq: Once | INTRAVENOUS | Status: AC
  Administered 2021-02-08: 1000 mL via INTRAVENOUS

## 2021-02-07 NOTE — ED Notes (Signed)
Pt unable to urinate at this time. Given specimen cup for when is able to do so.  ?

## 2021-02-07 NOTE — ED Triage Notes (Signed)
Pt with lower abd pain, nausea, vomiting and diarrhea that began this am at 0600. Pt states she is not able to tolerate po. Pt with moist oral mucus membranes.

## 2021-02-07 NOTE — ED Provider Notes (Signed)
[facility] Emergency Department Provider Note   ____________________________________________   Event Date/Time   First MD Initiated Contact with Patient 02/07/21 2317     (approximate)  I have reviewed the triage vital signs and the nursing notes.   HISTORY  Chief Complaint Abdominal Pain    HPI Meghan Olson is a 35 y.o. female with no significant past medical history presents to the ED complaining of vomiting and diarrhea.  Patient reports that she has had about 48 hours of persistent nausea, vomiting, and diarrhea.  Symptoms are severe enough that she has not been able to keep down either liquids or solids for about the past 24 hours.  She has not had any blood in her emesis or stool.  She does report diffuse abdominal pain, described as crampy and worse when she goes to vomit.  She has had subjective fevers and chills, has not taken her temperature at home and denies taking any Tylenol or ibuprofen today.  She has not had any dysuria, or flank pain.  She denies any cough, chest pain, or shortness of breath.  She is not aware of any sick contacts.        No past medical history on file.  There are no problems to display for this patient.   Past Surgical History:  Procedure Laterality Date   HERNIA REPAIR      Prior to Admission medications   Medication Sig Start Date End Date Taking? Authorizing Provider  ondansetron (ZOFRAN ODT) 4 MG disintegrating tablet Take 1 tablet (4 mg total) by mouth every 8 (eight) hours as needed for nausea or vomiting. 02/08/21  Yes Chesley Noon, MD  HYDROcodone-acetaminophen (NORCO/VICODIN) 5-325 MG tablet Take 1 tablet by mouth every 6 (six) hours as needed for moderate pain. 11/07/19   Fisher, Roselyn Bering, PA-C  sulfamethoxazole-trimethoprim (BACTRIM DS) 800-160 MG tablet Take 1 tablet by mouth 2 (two) times daily. 11/07/19   Faythe Ghee, PA-C    Allergies Penicillins  No family history on file.  Social  History Social History   Tobacco Use   Smoking status: Every Day    Packs/day: 2.00    Types: Cigarettes   Smokeless tobacco: Never  Substance Use Topics   Alcohol use: Yes    Comment: occas.   Drug use: Yes    Review of Systems  Constitutional: No fever/chills Eyes: No visual changes. ENT: No sore throat. Cardiovascular: Denies chest pain. Respiratory: Denies shortness of breath. Gastrointestinal: Positive for abdominal pain, nausea, vomiting, and diarrhea.  No constipation. Genitourinary: Negative for dysuria. Musculoskeletal: Negative for back pain. Skin: Negative for rash. Neurological: Negative for headaches, focal weakness or numbness.  ____________________________________________   PHYSICAL EXAM:  VITAL SIGNS: ED Triage Vitals  Enc Vitals Group     BP 02/07/21 2102 (!) 167/111     Pulse Rate 02/07/21 2102 97     Resp 02/07/21 2102 16     Temp 02/07/21 2102 98.7 F (37.1 C)     Temp Source 02/07/21 2102 Oral     SpO2 02/07/21 2102 100 %     Weight 02/07/21 2103 135 lb (61.2 kg)     Height 02/07/21 2103 5\' 4"  (1.626 m)     Head Circumference --      Peak Flow --      Pain Score 02/07/21 2103 7     Pain Loc --      Pain Edu? --      Excl. in GC? --  Constitutional: Alert and oriented. Eyes: Conjunctivae are normal. Head: Atraumatic. Nose: No congestion/rhinnorhea. Mouth/Throat: Mucous membranes are dry. Neck: Normal ROM Cardiovascular: Normal rate, regular rhythm. Grossly normal heart sounds.  2+ radial pulses bilaterally. Respiratory: Normal respiratory effort.  No retractions. Lungs CTAB. Gastrointestinal: Soft and nontender. No distention. Genitourinary: deferred Musculoskeletal: No lower extremity tenderness nor edema. Neurologic:  Normal speech and language. No gross focal neurologic deficits are appreciated. Skin:  Skin is warm, dry and intact. No rash noted. Psychiatric: Mood and affect are normal. Speech and behavior are  normal.  ____________________________________________   LABS (all labs ordered are listed, but only abnormal results are displayed)  Labs Reviewed  COMPREHENSIVE METABOLIC PANEL - Abnormal; Notable for the following components:      Result Value   Sodium 134 (*)    CO2 13 (*)    BUN <5 (*)    Total Protein 8.8 (*)    AST 65 (*)    Total Bilirubin 1.4 (*)    Anion gap 17 (*)    All other components within normal limits  CBC - Abnormal; Notable for the following components:   Hemoglobin 15.3 (*)    MCV 102.0 (*)    MCH 34.5 (*)    All other components within normal limits  URINALYSIS, COMPLETE (UACMP) WITH MICROSCOPIC - Abnormal; Notable for the following components:   Color, Urine YELLOW (*)    APPearance HAZY (*)    Hgb urine dipstick MODERATE (*)    Ketones, ur 80 (*)    Protein, ur 100 (*)    All other components within normal limits  BASIC METABOLIC PANEL - Abnormal; Notable for the following components:   Sodium 134 (*)    CO2 15 (*)    Glucose, Bld 104 (*)    BUN <5 (*)    All other components within normal limits  LIPASE, BLOOD  PREGNANCY, URINE  POC URINE PREG, ED    PROCEDURES  Procedure(s) performed (including Critical Care):  Procedures   ____________________________________________   INITIAL IMPRESSION / ASSESSMENT AND PLAN / ED COURSE      35 year old female with no significant past medical history presents to the ED for persistent nausea, vomiting, and diarrhea over the past 2 days with associated abdominal cramping.  She appears dehydrated but has no focal tenderness on exam, vomiting and diarrhea seem most consistent with a gastroenteritis.  Labs show acidosis and mild anion gap, likely related to dehydration.  We will hydrate with IV fluids and give dose of Zofran, pregnancy testing and UA are also pending.  UA shows no signs of infection, pregnancy testing is negative.  Patient is feeling better following IV fluids and Zofran, repeat BMP shows  improved acidosis with normalization of anion gap.  Patient does admit to drinking approximately 4 shots of liquor on a daily basis and I suspect abnormal electrolytes are due to a combination of dehydration and alcoholic ketoacidosis.  Patient reports feeling better and is requesting to be discharged home, does request 1 more dose of ODT Zofran prior to discharge.  She was counseled to follow-up with PCP for recheck of her blood pressure along with repeat BMP.  She was counseled to return to the ED for new worsening symptoms, patient agrees with plan.      ____________________________________________   FINAL CLINICAL IMPRESSION(S) / ED DIAGNOSES  Final diagnoses:  Gastroenteritis  Alcoholic ketoacidosis     ED Discharge Orders          Ordered  ondansetron (ZOFRAN ODT) 4 MG disintegrating tablet  Every 8 hours PRN        02/08/21 0223             Note:  This document was prepared using Dragon voice recognition software and may include unintentional dictation errors.    Chesley Noon, MD 02/08/21 [phone]

## 2021-02-08 LAB — LIPASE, BLOOD: Lipase: 21 U/L (ref 11–51)

## 2021-02-08 LAB — BASIC METABOLIC PANEL
Anion gap: 14 (ref 5–15)
BUN: <5 mg/dL — ABNORMAL LOW (ref 6–20)
CO2: 15 mmol/L — ABNORMAL LOW (ref 22–32)
Calcium: 9.6 mg/dL (ref 8.9–10.3)
Chloride: 105 mmol/L (ref 98–111)
Creatinine, Ser: 0.62 mg/dL (ref 0.44–1.00)
GFR, Estimated: >60 mL/min (ref >60)
Glucose, Bld: 104 mg/dL — ABNORMAL HIGH (ref 70–99)
Potassium: 4.4 mmol/L (ref 3.5–5.1)
Sodium: 134 mmol/L — ABNORMAL LOW (ref 135–145)

## 2021-02-08 LAB — URINALYSIS, COMPLETE (UACMP) WITH MICROSCOPIC
Bacteria, UA: NONE SEEN
Bilirubin Urine: NEGATIVE
Glucose, UA: NEGATIVE mg/dL
Ketones, ur: 80 mg/dL — AB
Leukocytes,Ua: NEGATIVE
Nitrite: NEGATIVE
Protein, ur: 100 mg/dL — AB
Specific Gravity, Urine: 1.02 (ref 1.005–1.030)
pH: 5 (ref 5.0–8.0)

## 2021-02-08 LAB — PREGNANCY, URINE: Preg Test, Ur: NEGATIVE

## 2021-02-08 MED ORDER — ONDANSETRON 4 MG PO TBDP: 4.0000 mg | ORAL_TABLET | Freq: Three times a day (TID) | ORAL | 0 refills | Status: DC | PRN

## 2021-02-08 MED ORDER — ONDANSETRON 4 MG PO TBDP
4.0000 mg | ORAL_TABLET | Freq: Once | ORAL | Status: AC
  Administered 2021-02-08: 4 mg via ORAL
  Filled 2021-02-08: qty 1

## 2021-02-08 NOTE — ED Notes (Signed)
Pt. Verbal consent for d/c.

## 2021-12-12 ENCOUNTER — Observation Stay
Admission: EM | Admit: 2021-12-12 | Discharge: 2021-12-12 | Discharge status: Against Medical Advice | Payer: Medicaid Other | Attending: Internal Medicine | Admitting: Internal Medicine

## 2021-12-12 ENCOUNTER — Other Ambulatory Visit: Payer: Self-pay

## 2021-12-12 DIAGNOSIS — T783XXA Angioneurotic edema, initial encounter: Secondary | ICD-10-CM | POA: Diagnosis not present

## 2021-12-12 DIAGNOSIS — Y906 Blood alcohol level of 120-199 mg/100 ml: Secondary | ICD-10-CM | POA: Diagnosis not present

## 2021-12-12 DIAGNOSIS — F10920 Alcohol use, unspecified with intoxication, uncomplicated: Secondary | ICD-10-CM | POA: Insufficient documentation

## 2021-12-12 DIAGNOSIS — E876 Hypokalemia: Secondary | ICD-10-CM | POA: Insufficient documentation

## 2021-12-12 DIAGNOSIS — Z79899 Other long term (current) drug therapy: Secondary | ICD-10-CM | POA: Diagnosis not present

## 2021-12-12 DIAGNOSIS — Z789 Other specified health status: Secondary | ICD-10-CM | POA: Diagnosis present

## 2021-12-12 DIAGNOSIS — Z720 Tobacco use: Secondary | ICD-10-CM | POA: Diagnosis not present

## 2021-12-12 DIAGNOSIS — F17210 Nicotine dependence, cigarettes, uncomplicated: Secondary | ICD-10-CM | POA: Insufficient documentation

## 2021-12-12 DIAGNOSIS — D72829 Elevated white blood cell count, unspecified: Secondary | ICD-10-CM | POA: Diagnosis not present

## 2021-12-12 DIAGNOSIS — T7840XA Allergy, unspecified, initial encounter: Secondary | ICD-10-CM | POA: Diagnosis present

## 2021-12-12 HISTORY — DX: Tobacco use: Z72.0

## 2021-12-12 HISTORY — DX: Alcohol use, unspecified, uncomplicated: F10.90

## 2021-12-12 HISTORY — DX: Other specified health status: Z78.9

## 2021-12-12 LAB — CBC WITH DIFFERENTIAL/PLATELET
Abs Immature Granulocytes: 0.06 10*3/uL (ref 0.00–0.07)
Basophils Absolute: 0 10*3/uL (ref 0.0–0.1)
Basophils Relative: 0 %
Eosinophils Absolute: 0 10*3/uL (ref 0.0–0.5)
Eosinophils Relative: 0 %
HCT: 43.7 % (ref 36.0–46.0)
Hemoglobin: 14.5 g/dL (ref 12.0–15.0)
Immature Granulocytes: 1 %
Lymphocytes Relative: 40 %
Lymphs Abs: 4.8 10*3/uL — ABNORMAL HIGH (ref 0.7–4.0)
MCH: 32.7 pg (ref 26.0–34.0)
MCHC: 33.2 g/dL (ref 30.0–36.0)
MCV: 98.6 fL (ref 80.0–100.0)
Monocytes Absolute: 1.2 10*3/uL — ABNORMAL HIGH (ref 0.1–1.0)
Monocytes Relative: 10 %
Neutro Abs: 6 10*3/uL (ref 1.7–7.7)
Neutrophils Relative %: 49 %
Platelets: 228 10*3/uL (ref 150–400)
RBC: 4.43 MIL/uL (ref 3.87–5.11)
RDW: 12.8 % (ref 11.5–15.5)
WBC: 12.1 10*3/uL — ABNORMAL HIGH (ref 4.0–10.5)
nRBC: 0 % (ref 0.0–0.2)

## 2021-12-12 LAB — URINE DRUG SCREEN, QUALITATIVE (ARMC ONLY)
Amphetamines, Ur Screen: NOT DETECTED
Barbiturates, Ur Screen: NOT DETECTED
Benzodiazepine, Ur Scrn: NOT DETECTED
Cannabinoid 50 Ng, Ur ~~LOC~~: POSITIVE — AB
Cocaine Metabolite,Ur ~~LOC~~: POSITIVE — AB
MDMA (Ecstasy)Ur Screen: NOT DETECTED
Methadone Scn, Ur: NOT DETECTED
Opiate, Ur Screen: NOT DETECTED
Phencyclidine (PCP) Ur S: NOT DETECTED
Tricyclic, Ur Screen: NOT DETECTED

## 2021-12-12 LAB — COMPREHENSIVE METABOLIC PANEL
ALT: 27 U/L (ref 0–44)
AST: 37 U/L (ref 15–41)
Albumin: 3.4 g/dL — ABNORMAL LOW (ref 3.5–5.0)
Alkaline Phosphatase: 55 U/L (ref 38–126)
Anion gap: 11 (ref 5–15)
BUN: <5 mg/dL — ABNORMAL LOW (ref 6–20)
CO2: 20 mmol/L — ABNORMAL LOW (ref 22–32)
Calcium: 8.5 mg/dL — ABNORMAL LOW (ref 8.9–10.3)
Chloride: 108 mmol/L (ref 98–111)
Creatinine, Ser: 0.46 mg/dL (ref 0.44–1.00)
GFR, Estimated: >60 mL/min (ref >60)
Glucose, Bld: 117 mg/dL — ABNORMAL HIGH (ref 70–99)
Potassium: 3.1 mmol/L — ABNORMAL LOW (ref 3.5–5.1)
Sodium: 139 mmol/L (ref 135–145)
Total Bilirubin: 0.2 mg/dL — ABNORMAL LOW (ref 0.3–1.2)
Total Protein: 6.8 g/dL (ref 6.5–8.1)

## 2021-12-12 LAB — HCG, QUANTITATIVE, PREGNANCY: hCG, Beta Chain, Quant, S: 1 m[IU]/mL (ref <5)

## 2021-12-12 LAB — ETHANOL: Alcohol, Ethyl (B): 185 mg/dL — ABNORMAL HIGH (ref <10)

## 2021-12-12 MED ORDER — FAMOTIDINE IN NACL 20-0.9 MG/50ML-% IV SOLN
20.0000 mg | Freq: Two times a day (BID) | INTRAVENOUS | Status: DC
  Administered 2021-12-12: 20 mg via INTRAVENOUS
  Filled 2021-12-12: qty 50

## 2021-12-12 MED ORDER — FOLIC ACID 1 MG PO TABS
1.0000 mg | ORAL_TABLET | Freq: Every day | ORAL | Status: DC
Start: 2021-12-12 — End: 2021-12-13
  Administered 2021-12-12: 1 mg via ORAL
  Filled 2021-12-12: qty 1

## 2021-12-12 MED ORDER — THIAMINE HCL 100 MG/ML IJ SOLN: 100.0000 mg | Freq: Every day | INTRAMUSCULAR | Status: DC

## 2021-12-12 MED ORDER — EPINEPHRINE 0.3 MG/0.3ML IJ SOAJ: 0.3000 mg | Freq: Every day | INTRAMUSCULAR | Status: DC | PRN

## 2021-12-12 MED ORDER — POTASSIUM CHLORIDE CRYS ER 20 MEQ PO TBCR
40.0000 meq | EXTENDED_RELEASE_TABLET | Freq: Once | ORAL | Status: AC
  Administered 2021-12-12: 40 meq via ORAL
  Filled 2021-12-12: qty 2

## 2021-12-12 MED ORDER — NICOTINE 21 MG/24HR TD PT24
21.0000 mg | MEDICATED_PATCH | Freq: Every day | TRANSDERMAL | Status: DC
  Administered 2021-12-12: 21 mg via TRANSDERMAL
  Filled 2021-12-12: qty 1

## 2021-12-12 MED ORDER — POTASSIUM CHLORIDE 10 MEQ/100ML IV SOLN
10.0000 meq | Freq: Once | INTRAVENOUS | Status: AC
  Administered 2021-12-12: 10 meq via INTRAVENOUS
  Filled 2021-12-12: qty 100

## 2021-12-12 MED ORDER — LORAZEPAM 2 MG/ML IJ SOLN: 0.0000 mg | Freq: Two times a day (BID) | INTRAMUSCULAR | Status: DC

## 2021-12-12 MED ORDER — ALBUTEROL SULFATE (2.5 MG/3ML) 0.083% IN NEBU: 2.5000 mg | INHALATION_SOLUTION | RESPIRATORY_TRACT | Status: DC | PRN

## 2021-12-12 MED ORDER — LORAZEPAM 2 MG/ML IJ SOLN: 0.0000 mg | Freq: Four times a day (QID) | INTRAMUSCULAR | Status: DC

## 2021-12-12 MED ORDER — DIPHENHYDRAMINE HCL 50 MG/ML IJ SOLN
25.0000 mg | Freq: Two times a day (BID) | INTRAMUSCULAR | Status: DC
  Administered 2021-12-12: 25 mg via INTRAVENOUS
  Filled 2021-12-12: qty 1

## 2021-12-12 MED ORDER — METHYLPREDNISOLONE SODIUM SUCC 125 MG IJ SOLR
80.0000 mg | INTRAMUSCULAR | Status: DC
  Administered 2021-12-12: 80 mg via INTRAVENOUS
  Filled 2021-12-12: qty 2

## 2021-12-12 MED ORDER — TRANEXAMIC ACID-NACL 1000-0.7 MG/100ML-% IV SOLN
1000.0000 mg | Freq: Once | INTRAVENOUS | Status: AC
  Administered 2021-12-12: 1000 mg via INTRAVENOUS
  Filled 2021-12-12: qty 100

## 2021-12-12 MED ORDER — LORAZEPAM 2 MG/ML IJ SOLN: 1.0000 mg | INTRAMUSCULAR | Status: DC | PRN

## 2021-12-12 MED ORDER — THIAMINE HCL 100 MG PO TABS
100.0000 mg | ORAL_TABLET | Freq: Every day | ORAL | Status: DC
  Administered 2021-12-12: 100 mg via ORAL
  Filled 2021-12-12: qty 1

## 2021-12-12 MED ORDER — ADULT MULTIVITAMIN W/MINERALS CH
1.0000 | ORAL_TABLET | Freq: Every day | ORAL | Status: DC
  Administered 2021-12-12: 1 via ORAL
  Filled 2021-12-12: qty 1

## 2021-12-12 MED ORDER — SODIUM CHLORIDE 0.9 % IV SOLN: INTRAVENOUS | Status: DC

## 2021-12-12 MED ORDER — ENOXAPARIN SODIUM 40 MG/0.4ML IJ SOSY
40.0000 mg | PREFILLED_SYRINGE | INTRAMUSCULAR | Status: DC
Start: 2021-12-12 — End: 2021-12-13
  Filled 2021-12-12: qty 0.4

## 2021-12-12 MED ORDER — LORAZEPAM 1 MG PO TABS: 1.0000 mg | ORAL_TABLET | ORAL | Status: DC | PRN

## 2021-12-12 NOTE — Assessment & Plan Note (Signed)
Potassium 3.1 -Repleted potassium -Check magnesium level 

## 2021-12-12 NOTE — ED Notes (Signed)
Pt set up with breakfast tray. Pt endorses improvement with mouth tingling.

## 2021-12-12 NOTE — H&P (Signed)
History and Physical    Meghan Olson BZJ:[id number] DOB: 03-13-1986 DOA: 12/12/2021  Referring MD/NP/PA:   [facility]: [facility]   Patient coming from:  The patient is coming from home.  At baseline, pt is independent for most of ADL.        Chief Complaint: lip swelling  HPI: Meghan Olson is a 36 y.o. female with medical history significant of alcohol use and tobacco abuse, who presents with lip swelling.  Patient states that she was drinking vodka tonight and smoked marijuana at about 2:30 AM.  She states that she went outside to smoke a cigarette and noticed that her lips were swelling, and eyes were itching or watery, with mild tongue swelling.  She also had mild shortness breath, no cough or chest pain.  No fever or chills.  No nausea, vomiting, diarrhea or abdominal pain no symptoms of UTI. She called 911.  Patient was given IM epinephrine by first responders.  When EMS arrived patient was given another dose of IM epinephrine, last at 3:42 AM.  She was also given 50 mg of IV Benadryl, 125 mg of IV Solu-Medrol and 20 mg of IV Pepcid.  Patient states that her symptoms have improved.  No longer has shortness breath, but her lips are still swollen.  Patient states that she had some difficulty swallowing at the beginning, which has resolved now. She states she was eating sour cream and onion chips and drinking vodka.  She is allergic to penicillin and bee stings, but denies any insect bites or stings recently. She does not take any new medications. No family history of anyone with angioedema.  She denies any rash.     Data Reviewed and ED Course: pt was found to have WBC 12.1, alcohol level 185, potassium 3.1, GFR> 60, temperature normal, blood pressure 96/63, 101/71, heart rate of 109, 93, RR 20, oxygen saturation 99% on room air.  Patient is placed on PCU for observation.   EKG: I have personally reviewed.  Sinus rhythm, QTc 481, poor R wave progression,  anteroseptal infarction pattern   Review of Systems:   General: no fevers, chills, no body weight gain, fatigue HEENT: no blurry vision, hearing changes or sore throat. Has lip swelling and tongue swelling Respiratory: had dyspnea,  no coughing, wheezing CV: no chest pain, no palpitations GI: no nausea, vomiting, abdominal pain, diarrhea, constipation GU: no dysuria, burning on urination, increased urinary frequency, hematuria  Ext: no leg edema Neuro: no unilateral weakness, HCT, or tingling, no vision change or hearing loss Skin: no rash, no skin tear. MSK: No muscle spasm, no deformity, no limitation of range of movement in spin Heme: No easy bruising.  Travel history: No recent long distant travel.   Allergy:  Allergies  Allergen Reactions   Bee Venom Hives and Swelling   Penicillins Hives    Past Medical History:  Diagnosis Date   Alcohol use    Tobacco abuse     Past Surgical History:  Procedure Laterality Date   HERNIA REPAIR      Social History:  reports that she has been smoking cigarettes. She has been smoking an average of 2 packs per day. She has never used smokeless tobacco. She reports current alcohol use. She reports current drug use.  Family History:  Family History  Problem Relation Age of Onset   Heart failure Mother      Prior to Admission medications   Not on File  Physical Exam: Vitals:   12/12/21 0831 12/12/21 0900 12/12/21 1000 12/12/21 1018  BP: 109/64 94/60 101/68 101/68  Pulse: 85 84 85 84  Resp: 20 (!) 21 20   Temp:      TempSrc:      SpO2: 100% 98% 98%   Weight:      Height:       General: Not in acute distress HEENT:       Eyes: PERRL, EOMI, no scleral icterus.       ENT: No discharge from the ears and nose. Has moderate swollen lips, mild tongue swelling.  No stridor.  Mildly puffy eyes       Neck: No JVD, no bruit, no mass felt. Heme: No neck lymph node enlargement. Cardiac: S1/S2, RRR, No murmurs, No gallops or  rubs. Respiratory: No rales, wheezing, rhonchi or rubs. GI: Soft, nondistended, nontender, no rebound pain, no organomegaly, BS present. GU: No hematuria Ext: No pitting leg edema bilaterally. 1+DP/PT pulse bilaterally. Musculoskeletal: No joint deformities, No joint redness or warmth, no limitation of ROM in spin. Skin: No rashes.  Neuro: Alert, oriented X3, cranial nerves II-XII grossly intact, moves all extremities normally.  Psych: Patient is not psychotic, no suicidal or hemocidal ideation.  Labs on Admission: I have personally reviewed following labs and imaging studies  CBC: Recent Labs  Lab 12/12/21 0427  WBC 12.1*  NEUTROABS 6.0  HGB 14.5  HCT 43.7  MCV 98.6  PLT 228   Basic Metabolic Panel: Recent Labs  Lab 12/12/21 0427  NA 139  K 3.1*  CL 108  CO2 20*  GLUCOSE 117*  BUN <5*  CREATININE 0.46  CALCIUM 8.5*   GFR: Estimated Creatinine Clearance: 95.8 mL/min (by C-G formula based on SCr of 0.46 mg/dL). Liver Function Tests: Recent Labs  Lab 12/12/21 0427  AST 37  ALT 27  ALKPHOS 55  BILITOT 0.2*  PROT 6.8  ALBUMIN 3.4*   No results for input(s): "LIPASE", "AMYLASE" in the last 168 hours. No results for input(s): "AMMONIA" in the last 168 hours. Coagulation Profile: No results for input(s): "INR", "PROTIME" in the last 168 hours. Cardiac Enzymes: No results for input(s): "CKTOTAL", "CKMB", "CKMBINDEX", "TROPONINI" in the last 168 hours. BNP (last 3 results) No results for input(s): "PROBNP" in the last 8760 hours. HbA1C: No results for input(s): "HGBA1C" in the last 72 hours. CBG: No results for input(s): "GLUCAP" in the last 168 hours. Lipid Profile: No results for input(s): "CHOL", "HDL", "LDLCALC", "TRIG", "CHOLHDL", "LDLDIRECT" in the last 72 hours. Thyroid Function Tests: No results for input(s): "TSH", "T4TOTAL", "FREET4", "T3FREE", "THYROIDAB" in the last 72 hours. Anemia Panel: No results for input(s): "VITAMINB12", "FOLATE",  "FERRITIN", "TIBC", "IRON", "RETICCTPCT" in the last 72 hours. Urine analysis:    Component Value Date/Time   COLORURINE YELLOW (A) 02/07/2021 2110   APPEARANCEUR HAZY (A) 02/07/2021 2110   APPEARANCEUR Hazy 11/17/2013 0819   LABSPEC 1.020 02/07/2021 2110   LABSPEC 1.016 11/17/2013 0819   PHURINE 5.0 02/07/2021 2110   GLUCOSEU NEGATIVE 02/07/2021 2110   GLUCOSEU Negative 11/17/2013 0819   HGBUR MODERATE (A) 02/07/2021 2110   BILIRUBINUR NEGATIVE 02/07/2021 2110   BILIRUBINUR Negative 11/17/2013 0819   KETONESUR 80 (A) 02/07/2021 2110   PROTEINUR 100 (A) 02/07/2021 2110   NITRITE NEGATIVE 02/07/2021 2110   LEUKOCYTESUR NEGATIVE 02/07/2021 2110   LEUKOCYTESUR Trace 11/17/2013 0819   Sepsis Labs: @LABRCNTIP (procalcitonin:4,lacticidven:4) )No results found for this or any previous visit (from the past 240 hour(s)).  Radiological Exams on Admission: No results found.    Assessment/Plan Principal Problem:   Angioedema Active Problems:   Hypokalemia   Leukocytosis   Tobacco abuse   Alcohol use    Assessment and Plan: * Angioedema Patient has possible angioedema versus allergic reaction. Etiology is not clear.  Patient's symptoms have been improving.  Has patent airway now.  -place on PCU for obs -Monitor respiratory status carefully -Labs: C4, C3, C1 inhibitor level -Solu-Medrol 40 mg tid -IV Benadryl 25 mg bid -IV pepcid 20 mg bid -prn EpiPen -IVF -Tranexamic acid 1000 mg was given in ED  -check UDS  Hypokalemia Potassium 3.1 -Repleted potassium -Check magnesium level  Leukocytosis WBC 12.1, no signs of infection, likely reactive -Follow-up by CBC  Tobacco abuse - Did counseling about importance of quitting substance use and smoking -Nicotine patch  Alcohol use - CIWA protocol             DVT ppx: SQ Lovenox  Code Status: Full code  Family Communication: not done, no family member is at bed side.    Disposition Plan:  Anticipate  discharge back to previous environment  Consults called:  none  Admission status and Level of care: Progressive:   for obs     Severity of Illness:  The appropriate patient status for this patient is OBSERVATION. Observation status is judged to be reasonable and necessary in order to provide the required intensity of service to ensure the patient's safety. The patient's presenting symptoms, physical exam findings, and initial radiographic and laboratory data in the context of their medical condition is felt to place them at decreased risk for further clinical deterioration. Furthermore, it is anticipated that the patient will be medically stable for discharge from the hospital within 2 midnights of admission.        Date of Service 12/12/2021    Lorretta Harp [facility]   If 7PM-7AM, please contact night-coverage [email or web address] 12/12/2021, 10:35 AM

## 2021-12-12 NOTE — Assessment & Plan Note (Signed)
-   CIWA protocol 

## 2021-12-12 NOTE — ED Triage Notes (Addendum)
Patient from home brought in by EMS. Patient states she was eating tacos, drinking alcohol, and smoking weed with her boyfriend and felt tingling on her lips. Patient states nothing that she ate or drank was new. Patient states she was drinking vodka and drinks daily. Patient was given epi, solumedrol, benadryl, famotidine. Episode started at lunch time. Patient presents with swollen lips and and face.

## 2021-12-12 NOTE — Discharge Summary (Signed)
Physician Discharge Summary  Meghan Olson DOB: 1986-02-29 DOA: 12/12/2021  [facility]: [facility]  Admit date: 12/12/2021 Discharge date: 12/12/21  Recommendations for Outpatient Follow-up:  -none since pt left on AMA  Home Health: none Equipment/Devices: none  Discharge Condition: improved CODE STATUS: full Diet recommendation: regular  Brief/Interim Summary (HPI)  Meghan Olson is a 36 y.o. female with medical history significant of alcohol use and tobacco abuse, who presents with lip swelling.   Patient states that she was drinking vodka tonight and smoked marijuana at about 2:30 AM.  She states that she went outside to smoke a cigarette and noticed that her lips were swelling, and eyes were itching or watery, with mild tongue swelling.  She also had mild shortness breath, no cough or chest pain.  No fever or chills.  No nausea, vomiting, diarrhea or abdominal pain no symptoms of UTI. She called 911.  Patient was given IM epinephrine by first responders.  When EMS arrived patient was given another dose of IM epinephrine, last at 3:42 AM.  She was also given 50 mg of IV Benadryl, 125 mg of IV Solu-Medrol and 20 mg of IV Pepcid.  Patient states that her symptoms have improved.  No longer has shortness breath, but her lips are still swollen.  Patient states that she had some difficulty swallowing at the beginning, which has resolved now. She states she was eating sour cream and onion chips and drinking vodka.  She is allergic to penicillin and bee stings, but denies any insect bites or stings recently. She does not take any new medications. No family history of anyone with angioedema.  She denies any rash.       Data Reviewed and ED Course: pt was found to have WBC 12.1, alcohol level 185, potassium 3.1, GFR> 60, temperature normal, blood pressure 96/63, 101/71, heart rate of 109, 93, RR 20, oxygen saturation 99% on room air.  Patient is placed on  PCU for observation.     EKG: I have personally reviewed.  Sinus rhythm, QTc 481, poor R wave progression, anteroseptal infarction pattern     Subjective  -Lip swelling, improved  Discharge Diagnoses and Hospital Course:   Principal Problem:   Angioedema Active Problems:   Hypokalemia   Leukocytosis   Tobacco abuse   Alcohol use    Assessment and Plan: * Angioedema Patient has possible angioedema versus allergic reaction. Etiology is not clear.  Patient's symptoms have been improving after admission. Has patent airway.  Assessment and plan were made as below, unfortunately patient left hospital on AMA.  Per [employer] NP's note, " Informed by nursing patient stated she felt better and was adamant about leaving. AMA paper was signed. Airway was stable for over 8 hours".     -placed on PCU for obs -Monitored respiratory status carefully -Labs: C4, C3, C1 inhibitor level -Solu-Medrol 40 mg tid -IV Benadryl 25 mg bid -IV pepcid 20 mg bid -prn EpiPen -IVF -Tranexamic acid 1000 mg was given in ED  -checked UDS   Hypokalemia Potassium 3.1 -Repleted potassium -Check magnesium level --> 2.2   Leukocytosis WBC 12.1, no signs of infection, likely reactive   Tobacco abuse - Did counseling about importance of quitting substance use and smoking -Nicotine patch in hospital   Alcohol use - CIWA protocol in hospital       Discharge Instructions -none  Allergies as of 12/12/2021       Reactions   Bee Venom  Hives, Swelling   Penicillins Hives        Medication List    You have not been prescribed any medications.     Allergies  Allergen Reactions   Bee Venom Hives and Swelling   Penicillins Hives    Consultations: none   Procedures/Studies: No results found.    Discharge Exam: Vitals:   12/12/21 1758 12/12/21 1800  BP: 131/87 (!) 149/91  Pulse: 87 89  Resp: 16 (!) 22  Temp:    SpO2: 96% 100%   Vitals:   12/12/21 1300 12/12/21 1430  12/12/21 1758 12/12/21 1800  BP: 115/77 127/86 131/87 (!) 149/91  Pulse: 89 83 87 89  Resp: 19 (!) 25 16 (!) 22  Temp:      TempSrc:      SpO2: 97% 100% 96% 100%  Weight:      Height:       Physical examination was not done since patient left hospital AMA last night.  I was not in the hospital.   The results of significant diagnostics from this hospitalization (including imaging, microbiology, ancillary and laboratory) are listed below for reference.     Microbiology: No results found for this or any previous visit (from the past 240 hour(s)).   Labs: BNP (last 3 results) No results for input(s): "BNP" in the last 8760 hours. Basic Metabolic Panel: Recent Labs  Lab 12/12/21 0427  NA 139  K 3.1*  CL 108  CO2 20*  GLUCOSE 117*  BUN <5*  CREATININE 0.46  CALCIUM 8.5*   Liver Function Tests: Recent Labs  Lab 12/12/21 0427  AST 37  ALT 27  ALKPHOS 55  BILITOT 0.2*  PROT 6.8  ALBUMIN 3.4*   No results for input(s): "LIPASE", "AMYLASE" in the last 168 hours. No results for input(s): "AMMONIA" in the last 168 hours. CBC: Recent Labs  Lab 12/12/21 0427  WBC 12.1*  NEUTROABS 6.0  HGB 14.5  HCT 43.7  MCV 98.6  PLT 228   Cardiac Enzymes: No results for input(s): "CKTOTAL", "CKMB", "CKMBINDEX", "TROPONINI" in the last 168 hours. BNP: Invalid input(s): "POCBNP" CBG: No results for input(s): "GLUCAP" in the last 168 hours. D-Dimer No results for input(s): "DDIMER" in the last 72 hours. Hgb A1c No results for input(s): "HGBA1C" in the last 72 hours. Lipid Profile No results for input(s): "CHOL", "HDL", "LDLCALC", "TRIG", "CHOLHDL", "LDLDIRECT" in the last 72 hours. Thyroid function studies No results for input(s): "TSH", "T4TOTAL", "T3FREE", "THYROIDAB" in the last 72 hours.  Invalid input(s): "FREET3" Anemia work up No results for input(s): "VITAMINB12", "FOLATE", "FERRITIN", "TIBC", "IRON", "RETICCTPCT" in the last 72 hours. Urinalysis    Component  Value Date/Time   COLORURINE YELLOW (A) 02/07/2021 2110   APPEARANCEUR HAZY (A) 02/07/2021 2110   APPEARANCEUR Hazy 11/17/2013 0819   LABSPEC 1.020 02/07/2021 2110   LABSPEC 1.016 11/17/2013 0819   PHURINE 5.0 02/07/2021 2110   GLUCOSEU NEGATIVE 02/07/2021 2110   GLUCOSEU Negative 11/17/2013 0819   HGBUR MODERATE (A) 02/07/2021 2110   BILIRUBINUR NEGATIVE 02/07/2021 2110   BILIRUBINUR Negative 11/17/2013 0819   KETONESUR 80 (A) 02/07/2021 2110   PROTEINUR 100 (A) 02/07/2021 2110   NITRITE NEGATIVE 02/07/2021 2110   LEUKOCYTESUR NEGATIVE 02/07/2021 2110   LEUKOCYTESUR Trace 11/17/2013 0819   Sepsis Labs Recent Labs  Lab 12/12/21 0427  WBC 12.1*   Microbiology No results found for this or any previous visit (from the past 240 hour(s)).  Time coordinating discharge:  25 min  SIGNED:  Lorretta Harp, MD [facility] 12/13/2021, 7:11 AM   If 7PM-7AM, please contact night-coverage [email or web address]

## 2021-12-12 NOTE — ED Notes (Addendum)
Pt stating she is leaving with short notice to this writer, Pt states "I feel a lot better and dont want to stay here longer". Pt airway has been clear for 8+ hrs, IV removed from pt. AMA Form signed. Notified charge nurse, Aundra Millet and Marisa Cyphers, on call NP about pt ama disposition.

## 2021-12-12 NOTE — ED Notes (Signed)
Pt removed her IV and all monitoring leads. ERMD at bedside to discuss with pt plan to admit to keep her monitored for the angioedema. Pt still noted w/ peri-oral swelling. Breathin even-unlabored, in nad. Made aware another IV access will be established, and to keep her monitoring leads on

## 2021-12-12 NOTE — Assessment & Plan Note (Addendum)
Patient has possible angioedema versus allergic reaction. Etiology is not clear.  Patient's symptoms have been improving.  Has patent airway now.  -place on PCU for obs -Monitor respiratory status carefully -Labs: C4, C3, C1 inhibitor level -Solu-Medrol 40 mg tid -IV Benadryl 25 mg bid -IV pepcid 20 mg bid -prn EpiPen -IVF -Tranexamic acid 1000 mg was given in ED  -check UDS

## 2021-12-12 NOTE — Progress Notes (Signed)
Cross Cover Informed by nursing patient stated she felt better and was adamant about leaving. AMA paper was signed Airway was stable for over 8 hours.

## 2021-12-12 NOTE — Assessment & Plan Note (Signed)
-   Did counseling about importance of quitting substance use and smoking -Nicotine patch

## 2021-12-12 NOTE — ED Provider Notes (Addendum)
[facility] Provider Note    Event Date/Time   First MD Initiated Contact with Patient 12/12/21 0413     (approximate)   History   Allergic Reaction (Last epi given at 342am, .5mg )   HPI  Meghan Olson is a 36 y.o. female with history of alcohol abuse who presents to the emergency department EMS for possible allergic reaction.  Patient states that she was drinking vodka tonight and smoked marijuana.  She states that she went outside to smoke a cigarette and noticed that her eyes were itching, watering and then when she tried to smoke a cigarette she felt like her mouth felt weird.  She went inside and looked in the mirror and noticed that her lips and eyelids were very swollen.  She called 911.  Patient was given IM epinephrine by first responders.  When EMS arrived patient was given another dose of IM epinephrine, last at 3:42 PM.  She was also given 50 mg of IV Benadryl, 125 mg of IV Solu-Medrol and 20 mg of IV Pepcid.  She feels like she is no longer having shortness of breath but feels like her lips are more swollen than they were previously.  She feels like she is having a hard time swallowing.  No changes in her voice.  She denies any other new exposures.  She states she was eating sour cream and onion chips and drinking vodka tonight.  She is allergic to penicillin and bee stings but denies any insect bites or stings recently and does not take any medications.  Is not on blood pressure medication.  No family history of anyone with angioedema.  She denies any rash.  States she does not think she is allergic to alcohol because she drinks every day.  Denies any history of withdrawal symptoms.   History provided by patient and EMS.    History reviewed. No pertinent past medical history.  Past Surgical History:  Procedure Laterality Date   HERNIA REPAIR      MEDICATIONS:  Prior to Admission medications   Medication Sig Start Date End Date Taking?  Authorizing Provider  HYDROcodone-acetaminophen (NORCO/VICODIN) 5-325 MG tablet Take 1 tablet by mouth every 6 (six) hours as needed for moderate pain. 11/07/19   11/09/19, Sherrie Mustache, PA-C  ondansetron (ZOFRAN ODT) 4 MG disintegrating tablet Take 1 tablet (4 mg total) by mouth every 8 (eight) hours as needed for nausea or vomiting. 02/08/21   02/10/21, MD  sulfamethoxazole-trimethoprim (BACTRIM DS) 800-160 MG tablet Take 1 tablet by mouth 2 (two) times daily. 11/07/19   11/09/19, PA-C    Physical Exam   Triage Vital Signs: ED Triage Vitals  Enc Vitals Group     BP 12/12/21 0410 (!) 145/104     Pulse Rate 12/12/21 0410 (!) 109     Resp 12/12/21 0410 20     Temp 12/12/21 0410 98.4 F (36.9 C)     Temp Source 12/12/21 0410 Oral     SpO2 12/12/21 0410 97 %     Weight 12/12/21 0411 159 lb (72.1 kg)     Height 12/12/21 0411 5\' 5"  (1.651 m)     Head Circumference --      Peak Flow --      Pain Score --      Pain Loc --      Pain Edu? --      Excl. in GC? --     Most recent vital signs: Vitals:  12/12/21 0430 12/12/21 0538  BP: 110/83 96/63  Pulse: (!) 101 97  Resp: 18 17  Temp:    SpO2: 99% 100%    CONSTITUTIONAL: Alert and oriented and responds appropriately to questions.  Intoxicated.  Smells strongly of alcohol. HEAD: Normocephalic, atraumatic EYES: Conjunctivae clear, pupils appear equal, sclera nonicteric, mild swelling to the upper and lower eyelids bilaterally without redness, warmth, ecchymosis ENT: normal nose; moist mucous membranes, normal phonation, no stridor, no trismus or drooling, significant angioedema of the upper and lower lip.  Posterior oropharynx is patent without any soft tissue swelling.  No tonsillar hypertrophy or exudate.  No uvular deviation.  No uvular swelling. NECK: Supple, normal ROM CARD: RRR; S1 and S2 appreciated; no murmurs, no clicks, no rubs, no gallops RESP: Normal chest excursion without splinting or tachypnea; breath sounds clear  and equal bilaterally; no wheezes, no rhonchi, no rales, no hypoxia or respiratory distress, speaking full sentences ABD/GI: Normal bowel sounds; non-distended; soft, non-tender, no rebound, no guarding, no peritoneal signs BACK: The back appears normal EXT: Normal ROM in all joints; no deformity noted, no edema; no cyanosis SKIN: Normal color for age and race; warm; no rash on exposed skin, no urticaria NEURO: Moves all extremities equally, normal speech PSYCH: The patient's mood and manner are appropriate.   ED Results / Procedures / Treatments   LABS: (all labs ordered are listed, but only abnormal results are displayed) Labs Reviewed  CBC WITH DIFFERENTIAL/PLATELET - Abnormal; Notable for the following components:      Result Value   WBC 12.1 (*)    Lymphs Abs 4.8 (*)    Monocytes Absolute 1.2 (*)    All other components within normal limits  ETHANOL - Abnormal; Notable for the following components:   Alcohol, Ethyl (B) 185 (*)    All other components within normal limits  COMPREHENSIVE METABOLIC PANEL - Abnormal; Notable for the following components:   Potassium 3.1 (*)    CO2 20 (*)    Glucose, Bld 117 (*)    BUN <5 (*)    Calcium 8.5 (*)    Albumin 3.4 (*)    Total Bilirubin 0.2 (*)    All other components within normal limits  HCG, QUANTITATIVE, PREGNANCY     EKG:  EKG Interpretation  Date/Time:  Friday December 12 2021 04:11:20 EDT Ventricular Rate:  112 PR Interval:  159 QRS Duration: 83 QT Interval:  352 QTC Calculation: 481 R Axis:   39 Text Interpretation: Sinus tachycardia Anterior infarct, old Confirmed by Rochele Raring [phone]) on 12/12/2021 4:15:23 AM         RADIOLOGY: My personal review and interpretation of imaging:    I have personally reviewed all radiology reports.   No results found.   PROCEDURES:  Critical Care performed: Yes, see critical care procedure note(s)   CRITICAL CARE Performed by: Rochele Raring   Total critical care  time: 45 minutes  Critical care time was exclusive of separately billable procedures and treating other patients.  Critical care was necessary to treat or prevent imminent or life-threatening deterioration.  Critical care was time spent personally by me on the following activities: development of treatment plan with patient and/or surrogate as well as nursing, discussions with consultants, evaluation of patient's response to treatment, examination of patient, obtaining history from patient or surrogate, ordering and performing treatments and interventions, ordering and review of laboratory studies, ordering and review of radiographic studies, pulse oximetry and re-evaluation of patient's condition.   Marland Kitchen1-3  Lead EKG Interpretation  Performed by: Rollo Farquhar, Layla Maw, DO Authorized by: Joffre Lucks, Layla Maw, DO     Interpretation: normal     ECG rate:  97   ECG rate assessment: normal     Rhythm: sinus rhythm     Ectopy: none     Conduction: normal       IMPRESSION / MDM / ASSESSMENT AND PLAN / ED COURSE  I reviewed the triage vital signs and the nursing notes.    Patient here with possible allergic reaction/angioedema.  Unknown cause.  Received 2 rounds of IM epinephrine prior to arrival.  The patient is on the cardiac monitor to evaluate for evidence of arrhythmia and/or significant heart rate changes.   DIFFERENTIAL DIAGNOSIS (includes but not limited to):   Allergic reaction, angioedema, no sign of airway involvement, no history of hereditary angioedema, not on ACE inhibitors or ARBs   Patient's presentation is most consistent with acute presentation with potential threat to life or bodily function.   PLAN: Patient here with severe angioedema.  She has significant swelling of her eyelids but very significant swelling of her upper and lower lips.  Tongue appears may be slightly swollen but her posterior oropharynx appears normal without any soft tissue swelling noted they are and her  phonation is normal.  She is able to swallow and tolerate her secretions.  She has no urticaria, lungs are clear to auscultation and no hypotension.  She is already received Benadryl, Pepcid, Solu-Medrol and epi x2 with EMS and first responders.  Will place on cardiac monitoring and monitor very closely for any signs of worsening or airway involvement.  She will likely need admission given her significant angioedema unless there is rapid change.  She feels like since getting medications her lips are more swollen.   MEDICATIONS GIVEN IN ED: Medications  0.9 %  sodium chloride infusion ( Intravenous New Bag/Given 12/12/21 0416)  tranexamic acid (CYKLOKAPRON) IVPB 1,000 mg (1,000 mg Intravenous New Bag/Given 12/12/21 0529)     ED COURSE: Patient's labs show leukocytosis which could be reactive.  She has mild hypokalemia and acidosis with normal anion gap.  Alcohol level of 185.  On reevaluation patient is extremely drowsy and has pulled out her IV and taken herself off cardiac monitoring.  Discussed with her multiple times the importance of staying on the monitor and keeping her IV in place.  She verbalized understanding.  Her angioedema has improved but still not resolved and still quite significant.  The swelling of her eyelids is almost completely resolved.  Her lungs are still clear.  Her blood pressure has dropped slightly.  She is getting IV fluids.  I have recommended admission.  Patient initially is reluctant but after further explanation agrees to stay for further observation.  We will also try a dose of TXA for angioedema as I am not convinced that this is all an anaphylactic reaction as she cannot tell me anything that she was recently exposed to that would have caused this to come on suddenly.   We will discuss with hospitalist for admission.   6:38 AM  Pt stable.  Satting 100% on room air sleeping comfortably.  No significant change in angioedema after TXA.  CONSULTS:  Consulted and  discussed patient's case with hospitalist, Dr. Arville Care.  I have recommended admission and consulting physician agrees and will place admission orders.  Patient (and family if present) agree with this plan.   I reviewed all nursing notes, vitals, pertinent previous records.  All labs, EKGs, imaging ordered have been independently reviewed and interpreted by myself.    OUTSIDE RECORDS REVIEWED: Reviewed last office visit with Durward Mallard with general surgery on 06/03/2021.       FINAL CLINICAL IMPRESSION(S) / ED DIAGNOSES   Final diagnoses:  Angioedema, initial encounter  Alcoholic intoxication without complication (HCC)     Rx / DC Orders   ED Discharge Orders     None        Note:  This document was prepared using Dragon voice recognition software and may include unintentional dictation errors.   Sadira Standard, Layla Maw, DO 12/12/21 0546    Tasean Mancha, Layla Maw, DO 12/12/21 [phone]

## 2021-12-12 NOTE — ED Notes (Signed)
Upon rounding on pt, pt was out of bed in the bathroom. IV was pulled out and left on bed.

## 2021-12-12 NOTE — Assessment & Plan Note (Signed)
WBC 12.1, no signs of infection, likely reactive -Follow-up by CBC

## 2022-07-25 ENCOUNTER — Emergency Department
Admission: EM | Admit: 2022-07-25 | Discharge: 2022-07-25 | Disposition: A | Discharge status: Home / Self Care | Payer: Medicaid Other | Attending: Student in an Organized Health Care Education/Training Program | Admitting: Student in an Organized Health Care Education/Training Program

## 2022-07-25 ENCOUNTER — Emergency Department: Payer: Medicaid Other

## 2022-07-25 DIAGNOSIS — M7918 Myalgia, other site: Secondary | ICD-10-CM | POA: Insufficient documentation

## 2022-07-25 DIAGNOSIS — R112 Nausea with vomiting, unspecified: Secondary | ICD-10-CM | POA: Diagnosis present

## 2022-07-25 DIAGNOSIS — D72829 Elevated white blood cell count, unspecified: Secondary | ICD-10-CM | POA: Insufficient documentation

## 2022-07-25 DIAGNOSIS — Z20822 Contact with and (suspected) exposure to covid-19: Secondary | ICD-10-CM | POA: Insufficient documentation

## 2022-07-25 DIAGNOSIS — R5383 Other fatigue: Secondary | ICD-10-CM | POA: Diagnosis not present

## 2022-07-25 LAB — URINALYSIS, ROUTINE W REFLEX MICROSCOPIC
Bilirubin Urine: NEGATIVE
Glucose, UA: NEGATIVE mg/dL
Ketones, ur: 80 mg/dL — AB
Leukocytes,Ua: NEGATIVE
Nitrite: NEGATIVE
Protein, ur: 30 mg/dL — AB
Specific Gravity, Urine: 1.013 (ref 1.005–1.030)
pH: 5 (ref 5.0–8.0)

## 2022-07-25 LAB — COMPREHENSIVE METABOLIC PANEL
ALT: 18 U/L (ref 0–44)
AST: 26 U/L (ref 15–41)
Albumin: 4.5 g/dL (ref 3.5–5.0)
Alkaline Phosphatase: 76 U/L (ref 38–126)
Anion gap: 19 — ABNORMAL HIGH (ref 5–15)
BUN: 6 mg/dL (ref 6–20)
CO2: 11 mmol/L — ABNORMAL LOW (ref 22–32)
Calcium: 9.4 mg/dL (ref 8.9–10.3)
Chloride: 105 mmol/L (ref 98–111)
Creatinine, Ser: 0.91 mg/dL (ref 0.44–1.00)
GFR, Estimated: >60 mL/min (ref >60)
Glucose, Bld: 86 mg/dL (ref 70–99)
Potassium: 4.9 mmol/L (ref 3.5–5.1)
Sodium: 135 mmol/L (ref 135–145)
Total Bilirubin: 1.7 mg/dL — ABNORMAL HIGH (ref 0.3–1.2)
Total Protein: 9.2 g/dL — ABNORMAL HIGH (ref 6.5–8.1)

## 2022-07-25 LAB — CBC WITH DIFFERENTIAL/PLATELET
Abs Immature Granulocytes: 0.08 10*3/uL — ABNORMAL HIGH (ref 0.00–0.07)
Basophils Absolute: 0 10*3/uL (ref 0.0–0.1)
Basophils Relative: 0 %
Eosinophils Absolute: 0 10*3/uL (ref 0.0–0.5)
Eosinophils Relative: 0 %
HCT: 48.7 % — ABNORMAL HIGH (ref 36.0–46.0)
Hemoglobin: 15.5 g/dL — ABNORMAL HIGH (ref 12.0–15.0)
Immature Granulocytes: 1 %
Lymphocytes Relative: 9 %
Lymphs Abs: 1.1 10*3/uL (ref 0.7–4.0)
MCH: 32.9 pg (ref 26.0–34.0)
MCHC: 31.8 g/dL (ref 30.0–36.0)
MCV: 103.4 fL — ABNORMAL HIGH (ref 80.0–100.0)
Monocytes Absolute: 0.7 10*3/uL (ref 0.1–1.0)
Monocytes Relative: 5 %
Neutro Abs: 10.8 10*3/uL — ABNORMAL HIGH (ref 1.7–7.7)
Neutrophils Relative %: 85 %
Platelets: 360 10*3/uL (ref 150–400)
RBC: 4.71 MIL/uL (ref 3.87–5.11)
RDW: 12.9 % (ref 11.5–15.5)
WBC: 12.6 10*3/uL — ABNORMAL HIGH (ref 4.0–10.5)
nRBC: 0 % (ref 0.0–0.2)

## 2022-07-25 LAB — RESP PANEL BY RT-PCR (RSV, FLU A&B, COVID)  RVPGX2
Influenza A by PCR: NEGATIVE
Influenza B by PCR: NEGATIVE
Resp Syncytial Virus by PCR: NEGATIVE
SARS Coronavirus 2 by RT PCR: NEGATIVE

## 2022-07-25 MED ORDER — SODIUM CHLORIDE 0.9 % IV BOLUS
1000.0000 mL | Freq: Once | INTRAVENOUS | Status: AC
  Administered 2022-07-25: 1000 mL via INTRAVENOUS

## 2022-07-25 MED ORDER — ACETAMINOPHEN 325 MG PO TABS
650.0000 mg | ORAL_TABLET | Freq: Once | ORAL | Status: AC
  Administered 2022-07-25: 650 mg via ORAL
  Filled 2022-07-25: qty 2

## 2022-07-25 MED ORDER — ONDANSETRON HCL 4 MG/2ML IJ SOLN
4.0000 mg | Freq: Once | INTRAMUSCULAR | Status: AC
  Administered 2022-07-25: 4 mg via INTRAVENOUS
  Filled 2022-07-25: qty 2

## 2022-07-25 MED ORDER — ONDANSETRON 4 MG PO TBDP: 4.0000 mg | ORAL_TABLET | Freq: Three times a day (TID) | ORAL | 0 refills | Status: AC | PRN

## 2022-07-25 NOTE — ED Provider Notes (Signed)
[facility] Provider Note  Patient Contact: 6:26 PM (approximate)   History   Generalized Body Aches   HPI  Meghan Olson is a 37 y.o. female with a largely unremarkable past medical history, presents to the emergency department with nausea and vomiting for the past 2 days.  Patient states that she cannot maintain p.o. intake.  Patient states that she feels lethargic.  She has had some chills at home.  No chest pain or chest tightness or abdominal pain.  No pelvic pain.      Physical Exam   Triage Vital Signs: ED Triage Vitals  Enc Vitals Group     BP 07/25/22 1733 (!) 135/96     Pulse Rate 07/25/22 1733 91     Resp 07/25/22 1733 18     Temp 07/25/22 1733 98.1 F (36.7 C)     Temp Source 07/25/22 1733 Oral     SpO2 07/25/22 1733 97 %     Weight 07/25/22 1731 158 lb 11.7 oz (72 kg)     Height 07/25/22 1731 5' 5"$  (1.651 m)     Head Circumference --      Peak Flow --      Pain Score 07/25/22 1731 10     Pain Loc --      Pain Edu? --      Excl. in [place]? --     Most recent vital signs: Vitals:   07/25/22 1733 07/25/22 2115  BP: (!) 135/96   Pulse: 91 98  Resp: 18 17  Temp: 98.1 F (36.7 C)   SpO2: 97% 99%     Constitutional: Alert and oriented. Patient is lying supine. Eyes: Conjunctivae are normal. PERRL. EOMI. Head: Atraumatic. ENT:      Ears: Tympanic membranes are mildly injected with mild effusion bilaterally.       Nose: No congestion/rhinnorhea.      Mouth/Throat: Mucous membranes are moist. Posterior pharynx is mildly erythematous.  Hematological/Lymphatic/Immunilogical: No cervical lymphadenopathy.  Cardiovascular: Normal rate, regular rhythm. Normal S1 and S2.  Good peripheral circulation. Respiratory: Normal respiratory effort without tachypnea or retractions. Lungs CTAB. Good air entry to the bases with no decreased or absent breath sounds. Gastrointestinal: Bowel sounds 4 quadrants. Soft and nontender to palpation.  No guarding or rigidity. No palpable masses. No distention. No CVA tenderness. Musculoskeletal: Full range of motion to all extremities. No gross deformities appreciated. Neurologic:  Normal speech and language. No gross focal neurologic deficits are appreciated.  Skin:  Skin is warm, dry and intact. No rash noted. Psychiatric: Mood and affect are normal. Speech and behavior are normal. Patient exhibits appropriate insight and judgement.    ED Results / Procedures / Treatments   Labs (all labs ordered are listed, but only abnormal results are displayed) Labs Reviewed  CBC WITH DIFFERENTIAL/PLATELET - Abnormal; Notable for the following components:      Result Value   WBC 12.6 (*)    Hemoglobin 15.5 (*)    HCT 48.7 (*)    MCV 103.4 (*)    Neutro Abs 10.8 (*)    Abs Immature Granulocytes 0.08 (*)    All other components within normal limits  COMPREHENSIVE METABOLIC PANEL - Abnormal; Notable for the following components:   CO2 11 (*)    Total Protein 9.2 (*)    Total Bilirubin 1.7 (*)    Anion gap 19 (*)    All other components within normal limits  URINALYSIS, ROUTINE W REFLEX MICROSCOPIC -  Abnormal; Notable for the following components:   Color, Urine STRAW (*)    APPearance CLEAR (*)    Hgb urine dipstick SMALL (*)    Ketones, ur 80 (*)    Protein, ur 30 (*)    Bacteria, UA RARE (*)    All other components within normal limits  RESP PANEL BY RT-PCR (RSV, FLU A&B, COVID)  RVPGX2        RADIOLOGY  I personally viewed and evaluated these images as part of my medical decision making, as well as reviewing the written report by the radiologist.  ED Provider Interpretation: No acute abnormality on chest x-ray   PROCEDURES:  Critical Care performed: No  Procedures   MEDICATIONS ORDERED IN ED: Medications  sodium chloride 0.9 % bolus 1,000 mL (0 mLs Intravenous Stopped 07/25/22 1953)  ondansetron (ZOFRAN) injection 4 mg (4 mg Intravenous Given 07/25/22 1839)   acetaminophen (TYLENOL) tablet 650 mg (650 mg Oral Given 07/25/22 2015)     IMPRESSION / MDM / ASSESSMENT AND PLAN / ED COURSE  I reviewed the triage vital signs and the nursing notes.                              Assessment and plan Vomiting and nausea  37 year old female presents to the emergency department after heavily drinking alcohol for the past 2 to 3 days and then experiencing 2 days of nausea and vomiting.  Vital signs are reassuring at triage.  On exam, patient initially seemed quite nauseated and uncomfortable with no abdominal tenderness to palpation.  Patient did have mildly elevated white blood cell count at 12.6.  Anion gap elevated at 19 on CMP.  Normal glucose.  Urinalysis shows ketones and proteinuria but no other findings suggestive of UTI.  Suspect elevated anion gap is secondary to alcohol use combined with ketosis secondary to starvation over the past 2 days.  Patient received normal saline bolus and Zofran while in the emergency department and reported significant improvement and requested discharge.  Patient was discharged with a short course of Zofran for nausea.  Return precautions were given to return with new or worsening symptoms.   FINAL CLINICAL IMPRESSION(S) / ED DIAGNOSES   Final diagnoses:  Nausea and vomiting, unspecified vomiting type     Rx / DC Orders   ED Discharge Orders          Ordered    ondansetron (ZOFRAN-ODT) 4 MG disintegrating tablet  Every 8 hours PRN        07/25/22 2101             Note:  This document was prepared using Dragon voice recognition software and may include unintentional dictation errors.   Vallarie Mare [place], PA-C 07/25/22 2205    Merlyn Lot, MD 07/26/22 [phone]

## 2022-07-25 NOTE — ED Triage Notes (Signed)
Pt presents to the ED via POV due to generalized body aches and flu-like symptoms. Pt states these symptoms started yesterday at work. Pt states is she nauseous and did vomit. Pt is coughing in triage.

## 2022-10-17 ENCOUNTER — Other Ambulatory Visit: Payer: Self-pay

## 2022-10-17 DIAGNOSIS — A084 Viral intestinal infection, unspecified: Secondary | ICD-10-CM | POA: Diagnosis not present

## 2022-10-17 DIAGNOSIS — R112 Nausea with vomiting, unspecified: Secondary | ICD-10-CM | POA: Diagnosis present

## 2022-10-17 DIAGNOSIS — F17200 Nicotine dependence, unspecified, uncomplicated: Secondary | ICD-10-CM | POA: Insufficient documentation

## 2022-10-17 LAB — URINALYSIS, ROUTINE W REFLEX MICROSCOPIC
Bilirubin Urine: NEGATIVE
Glucose, UA: NEGATIVE mg/dL
Hgb urine dipstick: NEGATIVE
Ketones, ur: NEGATIVE mg/dL
Leukocytes,Ua: NEGATIVE
Nitrite: POSITIVE — AB
Protein, ur: NEGATIVE mg/dL
Specific Gravity, Urine: 1.003 — ABNORMAL LOW (ref 1.005–1.030)
pH: 6 (ref 5.0–8.0)

## 2022-10-17 LAB — COMPREHENSIVE METABOLIC PANEL
ALT: 17 U/L (ref 0–44)
AST: 47 U/L — ABNORMAL HIGH (ref 15–41)
Albumin: 4 g/dL (ref 3.5–5.0)
Alkaline Phosphatase: 72 U/L (ref 38–126)
Anion gap: 13 (ref 5–15)
BUN: 6 mg/dL (ref 6–20)
CO2: 21 mmol/L — ABNORMAL LOW (ref 22–32)
Calcium: 9.1 mg/dL (ref 8.9–10.3)
Chloride: 103 mmol/L (ref 98–111)
Creatinine, Ser: 0.48 mg/dL (ref 0.44–1.00)
GFR, Estimated: >60 mL/min (ref >60)
Glucose, Bld: 98 mg/dL (ref 70–99)
Potassium: 3.1 mmol/L — ABNORMAL LOW (ref 3.5–5.1)
Sodium: 137 mmol/L (ref 135–145)
Total Bilirubin: 0.6 mg/dL (ref 0.3–1.2)
Total Protein: 7.8 g/dL (ref 6.5–8.1)

## 2022-10-17 LAB — CBC
HCT: 39.2 % (ref 36.0–46.0)
Hemoglobin: 13.1 g/dL (ref 12.0–15.0)
MCH: 33.1 pg (ref 26.0–34.0)
MCHC: 33.4 g/dL (ref 30.0–36.0)
MCV: 99 fL (ref 80.0–100.0)
Platelets: 249 10*3/uL (ref 150–400)
RBC: 3.96 MIL/uL (ref 3.87–5.11)
RDW: 13 % (ref 11.5–15.5)
WBC: 7.3 10*3/uL (ref 4.0–10.5)
nRBC: 0 % (ref 0.0–0.2)

## 2022-10-17 LAB — LIPASE, BLOOD: Lipase: 31 U/L (ref 11–51)

## 2022-10-17 LAB — POC URINE PREG, ED: Preg Test, Ur: NEGATIVE

## 2022-10-17 LAB — PREGNANCY, URINE: Preg Test, Ur: NEGATIVE

## 2022-10-17 NOTE — ED Triage Notes (Signed)
Pt arrives via POV with CC of lower abdominal pain and diarrhea that has been ongoing for three days.

## 2022-10-18 ENCOUNTER — Telehealth: Payer: Self-pay | Admitting: Emergency Medicine

## 2022-10-18 ENCOUNTER — Emergency Department
Admission: EM | Admit: 2022-10-18 | Discharge: 2022-10-18 | Disposition: A | Discharge status: Home / Self Care | Payer: 59 | Attending: Emergency Medicine | Admitting: Emergency Medicine

## 2022-10-18 DIAGNOSIS — A084 Viral intestinal infection, unspecified: Secondary | ICD-10-CM

## 2022-10-18 DIAGNOSIS — R112 Nausea with vomiting, unspecified: Secondary | ICD-10-CM

## 2022-10-18 MED ORDER — NITROFURANTOIN MONOHYD MACRO 100 MG PO CAPS: 100.0000 mg | ORAL_CAPSULE | Freq: Two times a day (BID) | ORAL | 0 refills | Status: AC

## 2022-10-18 MED ORDER — POTASSIUM CHLORIDE CRYS ER 20 MEQ PO TBCR
40.0000 meq | EXTENDED_RELEASE_TABLET | Freq: Once | ORAL | Status: AC
  Administered 2022-10-18: 40 meq via ORAL
  Filled 2022-10-18: qty 2

## 2022-10-18 MED ORDER — ACETAMINOPHEN 500 MG PO TABS
1000.0000 mg | ORAL_TABLET | Freq: Once | ORAL | Status: AC
  Administered 2022-10-18: 1000 mg via ORAL
  Filled 2022-10-18: qty 2

## 2022-10-18 MED ORDER — KETOROLAC TROMETHAMINE 15 MG/ML IJ SOLN
15.0000 mg | Freq: Once | INTRAMUSCULAR | Status: AC
  Administered 2022-10-18: 15 mg via INTRAVENOUS
  Filled 2022-10-18: qty 1

## 2022-10-18 MED ORDER — POTASSIUM CHLORIDE 10 MEQ/100ML IV SOLN
10.0000 meq | Freq: Once | INTRAVENOUS | Status: AC
  Administered 2022-10-18: 10 meq via INTRAVENOUS
  Filled 2022-10-18: qty 100

## 2022-10-18 MED ORDER — ONDANSETRON HCL 4 MG PO TABS: 4.0000 mg | ORAL_TABLET | Freq: Every day | ORAL | 0 refills | Status: AC | PRN

## 2022-10-18 MED ORDER — SODIUM CHLORIDE 0.9 % IV BOLUS
1000.0000 mL | Freq: Once | INTRAVENOUS | Status: AC
  Administered 2022-10-18: 1000 mL via INTRAVENOUS

## 2022-10-18 NOTE — Discharge Instructions (Signed)
Drink plenty of fluids to stay well-hydrated-find Pedialyte or similar electrolyte rehydration formulas at your local pharmacy and drink throughout the day.  Take Zofran as needed for nausea and vomiting.  If you experience any new, worsening or unexpected symptoms like severe pain fever or blood in your stools then call your doctor right away or come back to the emergency department

## 2022-10-18 NOTE — Telephone Encounter (Signed)
Prescribed antibiotic for urinary tract infection

## 2022-10-18 NOTE — ED Provider Notes (Signed)
[facility] Provider Note    Event Date/Time   First MD Initiated Contact with Patient 10/18/22 0105     (approximate)   History   Diarrhea and Abdominal Pain   HPI  Meghan Olson is a 37 y.o. female   Past medical history of alcohol use and tobacco use who presents department with Nausea vomiting diarrhea for 3 days.  No fever or blood in the stools or emesis.  Denies urinary symptoms.  She has family members who had GI bug last week.  No recent foreign travel, exposure to natural water sources, antibiotic use or hospitalization.   External Medical Documents Reviewed: February 2024 emergency department visit for nausea and vomiting      Physical Exam   Triage Vital Signs: ED Triage Vitals  Enc Vitals Group     BP 10/17/22 2123 (!) 156/109     Pulse Rate 10/17/22 2123 91     Resp 10/17/22 2123 17     Temp 10/17/22 2123 98.3 F (36.8 C)     Temp Source 10/17/22 2123 Oral     SpO2 10/17/22 2123 98 %     Weight 10/17/22 2122 170 lb (77.1 kg)     Height 10/17/22 2122 5\' 5"  (1.651 m)     Head Circumference --      Peak Flow --      Pain Score 10/17/22 2122 8     Pain Loc --      Pain Edu? --      Excl. in [place]? --     Most recent vital signs: Vitals:   10/18/22 0059 10/18/22 0259  BP: (!) 149/110 (!) 140/80  Pulse: 87 70  Resp: 18 16  Temp: 98.1 F (36.7 C) 98 F (36.7 C)  SpO2: 100% 100%    General: Awake, no distress.  CV:  Good peripheral perfusion.  Resp:  Normal effort.  Abd:  No distention.  Other:  Appears comfortable nontoxic laying in the stretcher.  Normal vital signs.  Soft nontender abdomen to palpation in all quadrants.  Appears euvolemic.   ED Results / Procedures / Treatments   Labs (all labs ordered are listed, but only abnormal results are displayed) Labs Reviewed  COMPREHENSIVE METABOLIC PANEL - Abnormal; Notable for the following components:      Result Value   Potassium 3.1 (*)    CO2 21 (*)    AST  47 (*)    All other components within normal limits  URINALYSIS, ROUTINE W REFLEX MICROSCOPIC - Abnormal; Notable for the following components:   Color, Urine STRAW (*)    APPearance CLEAR (*)    Specific Gravity, Urine 1.003 (*)    Nitrite POSITIVE (*)    Bacteria, UA RARE (*)    All other components within normal limits  LIPASE, BLOOD  CBC  PREGNANCY, URINE  POC URINE PREG, ED     I ordered and reviewed the above labs they are notable for potassium slightly low at 3.1, normal creatinine.    PROCEDURES:  Critical Care performed: No  Procedures   MEDICATIONS ORDERED IN ED: Medications  sodium chloride 0.9 % bolus 1,000 mL (0 mLs Intravenous Stopped 10/18/22 0251)  potassium chloride 10 mEq in 100 mL IVPB (0 mEq Intravenous Stopped 10/18/22 0251)  potassium chloride SA (KLOR-CON M) CR tablet 40 mEq (40 mEq Oral Given 10/18/22 0120)  ketorolac (TORADOL) 15 MG/ML injection 15 mg (15 mg Intravenous Given 10/18/22 0132)  acetaminophen (TYLENOL) tablet  1,000 mg (1,000 mg Oral Given 10/18/22 0120)    IMPRESSION / MDM / ASSESSMENT AND PLAN / ED COURSE  I reviewed the triage vital signs and the nursing notes.                              Patient's presentation is most consistent with acute presentation with potential threat to life or bodily function.  Differential diagnosis includes, but is not limited to, gastroenteritis, intra-abdominal infection, obstruction, urinary tract infection, pelvic infection, dehydration or metabolic derangements   The patient is on the cardiac monitor to evaluate for evidence of arrhythmia and/or significant heart rate changes.  MDM: Patient with nausea vomiting diarrhea in the setting of known sick contacts with GI symptoms most consistent with viral gastroenteritis.  Appears euvolemic.  Mildly hypokalemic will replete and give IV crystalloid bolus as well.  IV antiemetic given.  Prescription for Zofran.  Benign abdominal exam I doubt intra-abdominal  infection like appendicitis or cholecystitis at this time, defer advanced imaging.  She has nitrite positive urine but curiously no bacteria or leukocytes/white blood cells in the urine.  I will cover her with Macrobid.  Considered hospitalization/observation for serial abdominal exams but given her lack of fever, benign abdominal exam and no fever I doubt there is a surgical abdominal pathology at this time and so opted for outpatient monitoring and treatment with PMD follow-up-she understands to return to the emergency department any new or worsening symptoms for reevaluation.       FINAL CLINICAL IMPRESSION(S) / ED DIAGNOSES   Final diagnoses:  Viral gastroenteritis  Nausea vomiting and diarrhea     Rx / DC Orders   ED Discharge Orders          Ordered    ondansetron (ZOFRAN) 4 MG tablet  Daily PRN        10/18/22 0220             Note:  This document was prepared using Dragon voice recognition software and may include unintentional dictation errors.    Pilar Jarvis, MD 10/18/22 [phone]

## 2022-11-27 ENCOUNTER — Other Ambulatory Visit: Payer: 59

## 2022-12-07 ENCOUNTER — Other Ambulatory Visit: Payer: 59

## 2022-12-19 IMAGING — CR DG CHEST 2V
1 series · 2 of 2 positions shown · non-contrast
Comparison: None.

CLINICAL DATA: Fever, palpitations

EXAM:
CHEST - 2 VIEW

[Series 1: dg chest 2 view · 0.14mm/px · 2 of 2 slices shown]
[im 1/2]
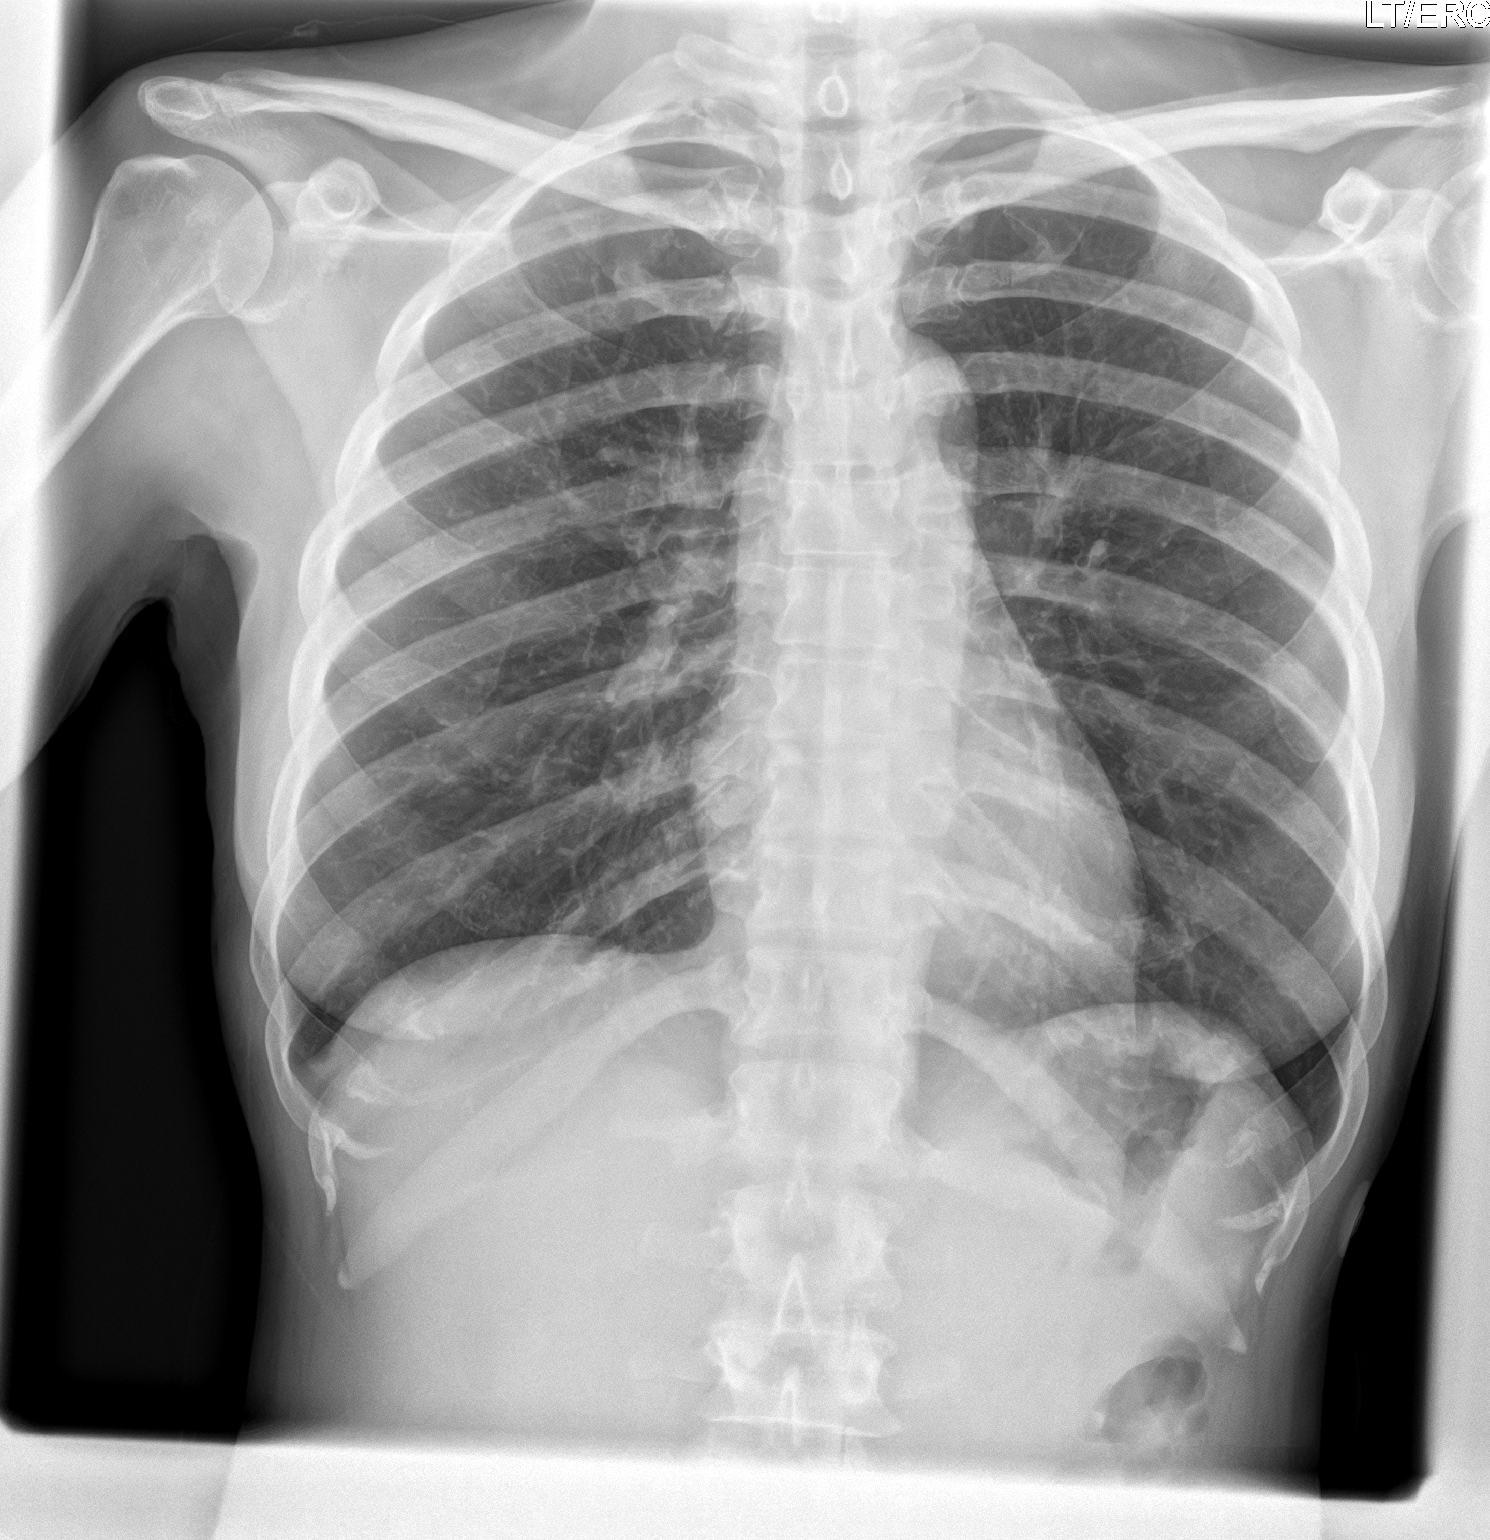
[im 2/2]
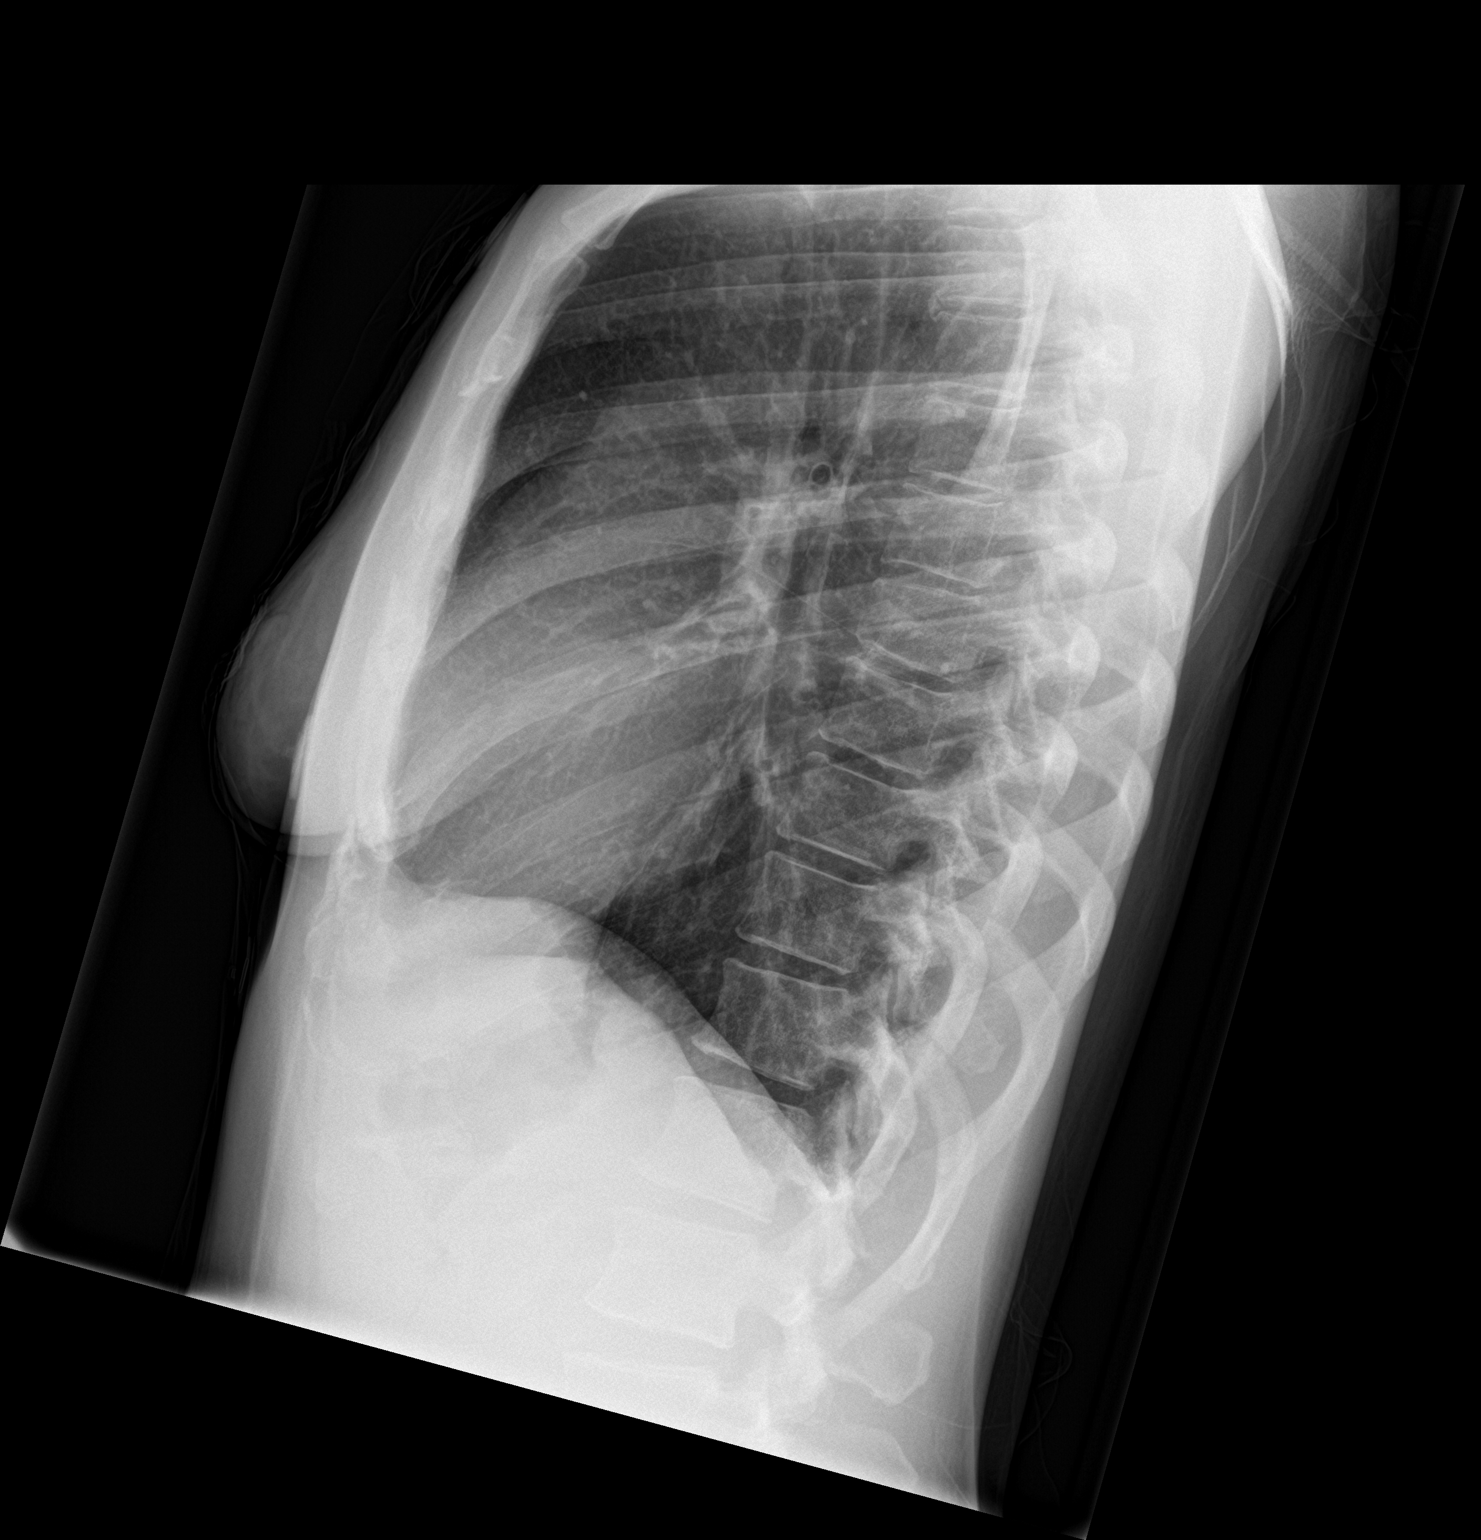

[2 of 2 positions shown; findings below may reference images not displayed]

FINDINGS: The cardiomediastinal contours are within normal limits. The lungs
are clear. No pneumothorax or pleural effusion. No acute finding in
the visualized skeleton.
IMPRESSION: No acute cardiopulmonary finding.

## 2023-02-11 ENCOUNTER — Ambulatory Visit: Payer: 59

## 2023-06-03 DIAGNOSIS — H2640 Unspecified secondary cataract: Secondary | ICD-10-CM | POA: Diagnosis not present

## 2023-11-16 ENCOUNTER — Emergency Department
Admission: EM | Admit: 2023-11-16 | Discharge: 2023-11-16 | Disposition: A | Discharge status: Home / Self Care | Payer: Self-pay | Attending: Emergency Medicine | Admitting: Emergency Medicine

## 2023-11-16 DIAGNOSIS — N390 Urinary tract infection, site not specified: Secondary | ICD-10-CM | POA: Insufficient documentation

## 2023-11-16 DIAGNOSIS — L02411 Cutaneous abscess of right axilla: Secondary | ICD-10-CM | POA: Insufficient documentation

## 2023-11-16 LAB — URINALYSIS, ROUTINE W REFLEX MICROSCOPIC
Bilirubin Urine: NEGATIVE
Glucose, UA: NEGATIVE mg/dL
Hgb urine dipstick: NEGATIVE
Ketones, ur: NEGATIVE mg/dL
Nitrite: POSITIVE — AB
Protein, ur: NEGATIVE mg/dL
Specific Gravity, Urine: 1.004 — ABNORMAL LOW (ref 1.005–1.030)
pH: 7 (ref 5.0–8.0)

## 2023-11-16 MED ORDER — IBUPROFEN 600 MG PO TABS
600.0000 mg | ORAL_TABLET | Freq: Once | ORAL | Status: AC
  Administered 2023-11-16: 600 mg via ORAL
  Filled 2023-11-16: qty 1

## 2023-11-16 MED ORDER — SULFAMETHOXAZOLE-TRIMETHOPRIM 800-160 MG PO TABS
1.0000 | ORAL_TABLET | Freq: Once | ORAL | Status: AC
  Administered 2023-11-16: 1 via ORAL
  Filled 2023-11-16: qty 1

## 2023-11-16 MED ORDER — SULFAMETHOXAZOLE-TRIMETHOPRIM 800-160 MG PO TABS: 1.0000 | ORAL_TABLET | Freq: Two times a day (BID) | ORAL | 0 refills | Status: AC

## 2023-11-16 MED ORDER — LIDOCAINE-EPINEPHRINE (PF) 2 %-1:200000 IJ SOLN
10.0000 mL | Freq: Once | INTRAMUSCULAR | Status: AC
  Administered 2023-11-16: 10 mL
  Filled 2023-11-16: qty 20

## 2023-11-16 NOTE — Discharge Instructions (Addendum)
 You have been seen in the Emergency Department (ED) today for an abscess.  This was drained in the ED. you also have a urinary tract infection.  I am going to give you an antibiotic that we will treat both of these.  Please finish out the entire course of antibiotic.  Please follow up with your doctor or in the ED in 24-48 hours for recheck of your wound.  Read through the additional discharge instructions included below regarding wound care recommendations.  Keep the wound clean and dry, though you may wash as you would normally.  Change the dressing twice daily.  Call your doctor sooner or return to the ED if you develop worsening signs of infection such as: increased redness, increased pain, pus, or fever as well as any pain in your back or worsening urinary symptoms.   Abscess An abscess is an infected area that contains a collection of pus and debris. It can occur in almost any part of the body. An abscess is also known as a furuncle or boil. CAUSES  An abscess occurs when tissue gets infected. This can occur from blockage of oil or sweat glands, infection of hair follicles, or a minor injury to the skin. As the body tries to fight the infection, pus collects in the area and creates pressure under the skin. This pressure causes pain. People with weakened immune systems have difficulty fighting infections and get certain abscesses more often.  SYMPTOMS Usually an abscess develops on the skin and becomes a painful mass that is red, warm, and tender. If the abscess forms under the skin, you may feel a moveable soft area under the skin. Some abscesses break open (rupture) on their own, but most will continue to get worse without care. The infection can spread deeper into the body and eventually into the bloodstream, causing you to feel ill.  DIAGNOSIS  Your caregiver will take your medical history and perform a physical exam. A sample of fluid may also be taken from the abscess to determine what is  causing your infection. TREATMENT  Your caregiver may prescribe antibiotic medicines to fight the infection. However, taking antibiotics alone usually does not cure an abscess. Your caregiver may need to make a small cut (incision) in the abscess to drain the pus. In some cases, gauze is packed into the abscess to reduce pain and to continue draining the area. HOME CARE INSTRUCTIONS  Only take over-the-counter or prescription medicines for pain, discomfort, or fever as directed by your caregiver. If you were prescribed antibiotics, take them as directed. Finish them even if you start to feel better. If gauze is used, follow your caregiver's directions for changing the gauze. To avoid spreading the infection: Keep your draining abscess covered with a bandage. Wash your hands well. Do not share personal care items, towels, or whirlpools with others. Avoid skin contact with others. Keep your skin and clothes clean around the abscess. Keep all follow-up appointments as directed by your caregiver. SEEK MEDICAL CARE IF:  You have increased pain, swelling, redness, fluid drainage, or bleeding. You have muscle aches, chills, or a general ill feeling. You have a fever. MAKE SURE YOU:  Understand these instructions. Will watch your condition. Will get help right away if you are not doing well or get worse. Document Released: 03/11/2005 Document Revised: 12/01/2011 Document Reviewed: 08/14/2011 [facility] Patient Information 2015 [place], [place]. This information is not intended to replace advice given to you by your health care provider. Make sure you  discuss any questions you have with your health care provider.  Abscess Care After An abscess (also called a boil or furuncle) is an infected area that contains a collection of pus. Signs and symptoms of an abscess include pain, tenderness, redness, or hardness, or you may feel a moveable soft area under your skin. An abscess can occur anywhere in the  body. The infection may spread to surrounding tissues causing cellulitis. A cut (incision) by the surgeon was made over your abscess and the pus was drained out. Gauze may have been packed into the space to provide a drain that will allow the cavity to heal from the inside outwards. The boil may be painful for 5 to 7 days. Most people with a boil do not have high fevers. Your abscess, if seen early, may not have localized, and may not have been lanced. If not, another appointment may be required for this if it does not get better on its own or with medications. HOME CARE INSTRUCTIONS  Only take over-the-counter or prescription medicines for pain, discomfort, or fever as directed by your caregiver. When you bathe, soak and then remove gauze or iodoform packs at least daily or as directed by your caregiver. You may then wash the wound gently with mild soapy water. Repack with gauze or do as your caregiver directs. SEEK IMMEDIATE MEDICAL CARE IF:  You develop increased pain, swelling, redness, drainage, or bleeding in the wound site. You develop signs of generalized infection including muscle aches, chills, fever, or a general ill feeling. An oral temperature above 102 F (38.9 C) develops, not controlled by medication. See your caregiver for a recheck if you develop any of the symptoms described above. If medications (antibiotics) were prescribed, take them as directed. Document Released: 12/18/2004 Document Revised: 08/24/2011 Document Reviewed: 08/15/2007 [facility] Patient Information 2015 [place], [place]. This information is not intended to replace advice given to you by your health care provider. Make sure you discuss any questions you have with your health care provider.  Cellulitis Cellulitis is an infection of the skin and the tissue beneath it. The infected area is usually red and tender. Cellulitis occurs most often in the arms and lower legs.  CAUSES  Cellulitis is caused by bacteria that  enter the skin through cracks or cuts in the skin. The most common types of bacteria that cause cellulitis are staphylococci and streptococci. SIGNS AND SYMPTOMS  Redness and warmth. Swelling. Tenderness or pain. Fever. DIAGNOSIS  Your health care provider can usually determine what is wrong based on a physical exam. Blood tests may also be done. TREATMENT  Treatment usually involves taking an antibiotic medicine. HOME CARE INSTRUCTIONS  Take your antibiotic medicine as directed by your health care provider. Finish the antibiotic even if you start to feel better. Keep the infected arm or leg elevated to reduce swelling. Apply a warm cloth to the affected area up to 4 times per day to relieve pain. Take medicines only as directed by your health care provider. Keep all follow-up visits as directed by your health care provider. SEEK MEDICAL CARE IF:  You notice red streaks coming from the infected area. Your red area gets larger or turns dark in color. Your bone or joint underneath the infected area becomes painful after the skin has healed. Your infection returns in the same area or another area. You notice a swollen bump in the infected area. You develop new symptoms. You have a fever. SEEK IMMEDIATE MEDICAL CARE IF:  You  feel very sleepy. You develop vomiting or diarrhea. You have a general ill feeling (malaise) with muscle aches and pains. MAKE SURE YOU:  Understand these instructions. Will watch your condition. Will get help right away if you are not doing well or get worse. Document Released: 03/11/2005 Document Revised: 10/16/2013 Document Reviewed: 08/17/2011 [facility] Patient Information 2015 [place], [place]. This information is not intended to replace advice given to you by your health care provider. Make sure you discuss any questions you have with your health care provider.

## 2023-11-16 NOTE — ED Triage Notes (Signed)
 Pt presents to the ED via POV from home with abscess under right arm. Pt reports first noticing this about a week ago. Hx of abscesses. Has tried warm compresses without relief. Pt also reports that her urine has a strong odor. Denies urinary sx's.

## 2023-11-16 NOTE — ED Provider Notes (Signed)
 [facility] Provider Note    None    (approximate)   History   Abscess   HPI  Meghan Olson is a 38 y.o. female  presenting to the emergency department with abscess under her right axilla x 1 week.  Patient states this has been recurrent multiple times in her right axilla, only 1 or 2 times in her left axilla.  Patient reports puslike discharge but no fever, no erythema.  Patient also reports smell with her urine and orange-colored urine that has been present for 2 days.  Reports she does not drink any water at home, mostly alcohol. Denies dysuria, discharge, pain with intercourse.  Past medical history includes tobacco abuse, alcohol use.        Physical Exam   Triage Vital Signs: ED Triage Vitals  Encounter Vitals Group     BP 11/16/23 1415 (!) 144/103     Systolic BP Percentile --      Diastolic BP Percentile --      Pulse Rate 11/16/23 1415 (!) 111     Resp 11/16/23 1415 18     Temp 11/16/23 1415 98.4 F (36.9 C)     Temp Source 11/16/23 1415 Oral     SpO2 11/16/23 1415 99 %     Weight 11/16/23 1416 162 lb (73.5 kg)     Height 11/16/23 1416 5\' 3"  (1.6 m)     Head Circumference --      Peak Flow --      Pain Score 11/16/23 1416 10     Pain Loc --      Pain Education --      Exclude from Growth Chart --     Most recent vital signs: Vitals:   11/16/23 1415  BP: (!) 144/103  Pulse: (!) 111  Resp: 18  Temp: 98.4 F (36.9 C)  SpO2: 99%    General: Well-appearing, in no acute distress. Appears stated age. Head: Normocephalic, atraumatic. CV: Radial pulses 2+ and symmetric. No edema. Respiratory: No respiratory distress. Normal respiratory effort. GI: Soft, non-distended.  GU: No CVA tenderness bilaterally. Skin:Warm, dry.  Multiple areas of fluctuance, wound with puslike drainage under right axilla.  Neurological: A&Ox4 to person, place, time, and situation.   ED Results / Procedures / Treatments   Labs (all labs ordered  are listed, but only abnormal results are displayed) Labs Reviewed  URINALYSIS, ROUTINE W REFLEX MICROSCOPIC - Abnormal; Notable for the following components:      Result Value   Color, Urine YELLOW (*)    APPearance HAZY (*)    Specific Gravity, Urine 1.004 (*)    Nitrite POSITIVE (*)    Leukocytes,Ua SMALL (*)    Bacteria, UA MANY (*)    All other components within normal limits     EKG    RADIOLOGY    PROCEDURES:  Critical Care performed: No  .Incision and Drainage  Date/Time: 11/16/2023 4:09 PM  Performed by: Thomasenia Flesher, PA-C Authorized by: Thomasenia Flesher, PA-C   Consent:    Consent obtained:  Verbal   Consent given by:  Patient   Risks, benefits, and alternatives were discussed: yes     Risks discussed:  Bleeding, damage to other organs, incomplete drainage, infection and pain Universal protocol:    Procedure explained and questions answered to patient or proxy's satisfaction: yes     Immediately prior to procedure, a time out was called: yes     Patient identity confirmed:  Verbally  with patient Location:    Type:  Abscess   Size:  3 total abscesses (1x1 cm, 1x1 cm, 3x2 cm)   Location: Right axilla. Pre-procedure details:    Skin preparation:  Povidone-iodine Sedation:    Sedation type:  None Anesthesia:    Anesthesia method:  Local infiltration   Local anesthetic:  Lidocaine  2% WITH epi Procedure type:    Complexity:  Complex Procedure details:    Ultrasound guidance: no     Needle aspiration: no     Incision types:  Single straight (3 total incisions made.)   Incision depth:  Subcutaneous   Wound management:  Probed and deloculated and irrigated with saline   Drainage:  Bloody and purulent   Drainage amount:  Moderate   Wound treatment:  Wound left open   Packing materials:  1/4 in iodoform gauze (1 out of 3 incisions packed)   Amount 1/4" iodoform:  4 cm Post-procedure details:    Procedure completion:  Tolerated well, no immediate  complications    MEDICATIONS ORDERED IN ED: Medications  ibuprofen  (ADVIL ) tablet 600 mg (has no administration in time range)  sulfamethoxazole -trimethoprim  (BACTRIM  DS) 800-160 MG per tablet 1 tablet (has no administration in time range)  lidocaine -EPINEPHrine  (XYLOCAINE  W/EPI) 2 %-1:200000 (PF) injection 10 mL (10 mLs Infiltration Given 11/16/23 1517)     IMPRESSION / MDM / ASSESSMENT AND PLAN / ED COURSE  I reviewed the triage vital signs and the nursing notes.                              Differential diagnosis includes, but is not limited to, abscess, cyst, lipoma, hidradenitis suppurativa, UTI, pyelonephritis  Patient's presentation is most consistent with acute complicated illness / injury requiring diagnostic workup.  Patient presented today with multiple abscesses of the right axilla.  Incision and drainage procedure was completed with 3 incisions, 1 was packed with gauze and the other 2 were left open.  Please see procedure note for full details.  Patient also reported change in smell of urine but no other urinary symptoms or discharge.  UA was ordered and showed specific gravity 1.004, positive nitrates and small leukocytes, many bacteria.  1 dose of Bactrim  was given in the emergency department that will help with both the UTI and abscesses following drainage.  I highly encouraged her to drink at least 64 ounces of water per day at home.  Rest of antibiotic dosing was sent to pharmacy of choice.  Patient was also given ibuprofen  600 mg for pain while here.  She can continue taking ibuprofen  at home for pain based on instructions written on the bottle.  We discussed follow-up with her primary care provider or in the emergency department in 24 to 48 hours for wound recheck.  Work note given for today.  Emergency department return precautions were discussed with the patient.  Patient is in agreement to the treatment plan.  Patient is stable for discharge.  FINAL CLINICAL IMPRESSION(S)  / ED DIAGNOSES   Final diagnoses:  Abscess of axilla, right  Urinary tract infection without hematuria, site unspecified     Rx / DC Orders   ED Discharge Orders          Ordered    sulfamethoxazole -trimethoprim  (BACTRIM  DS) 800-160 MG tablet  2 times daily        11/16/23 1600             Note:  This  document was prepared using Conservation officer, historic buildings and may include unintentional dictation errors.    Thomasenia Flesher, PA-C 11/16/23 1616    Jacquie Maudlin, MD 11/16/23 [phone]
